# Patient Record
Sex: Female | Born: 1960 | Race: Black or African American | Hispanic: No | Marital: Single | Smoking: Former smoker
Health system: Southern US, Community
[De-identification: ages and names within clinical notes are randomized; demographics above are authoritative.]

## PROBLEM LIST (undated history)

## (undated) DIAGNOSIS — I10 Essential (primary) hypertension: Secondary | ICD-10-CM

## (undated) DIAGNOSIS — E119 Type 2 diabetes mellitus without complications: Secondary | ICD-10-CM

## (undated) DIAGNOSIS — M1990 Unspecified osteoarthritis, unspecified site: Secondary | ICD-10-CM

---

## 1990-05-16 HISTORY — PX: CHOLECYSTECTOMY, LAPAROSCOPIC: SHX56

## 2017-11-15 HISTORY — PX: CEREBRAL ANEURYSM REPAIR: SHX164

## 2019-01-29 DIAGNOSIS — M5441 Lumbago with sciatica, right side: Secondary | ICD-10-CM | POA: Diagnosis not present

## 2019-01-29 DIAGNOSIS — I10 Essential (primary) hypertension: Secondary | ICD-10-CM | POA: Diagnosis not present

## 2019-01-29 DIAGNOSIS — Z8679 Personal history of other diseases of the circulatory system: Secondary | ICD-10-CM | POA: Diagnosis not present

## 2019-02-13 DIAGNOSIS — M799 Soft tissue disorder, unspecified: Secondary | ICD-10-CM | POA: Diagnosis not present

## 2019-02-13 DIAGNOSIS — M545 Low back pain: Secondary | ICD-10-CM | POA: Diagnosis not present

## 2019-02-13 DIAGNOSIS — M6281 Muscle weakness (generalized): Secondary | ICD-10-CM | POA: Diagnosis not present

## 2019-02-13 DIAGNOSIS — M2560 Stiffness of unspecified joint, not elsewhere classified: Secondary | ICD-10-CM | POA: Diagnosis not present

## 2019-02-20 DIAGNOSIS — M799 Soft tissue disorder, unspecified: Secondary | ICD-10-CM | POA: Diagnosis not present

## 2019-02-20 DIAGNOSIS — M2560 Stiffness of unspecified joint, not elsewhere classified: Secondary | ICD-10-CM | POA: Diagnosis not present

## 2019-02-20 DIAGNOSIS — M545 Low back pain: Secondary | ICD-10-CM | POA: Diagnosis not present

## 2019-02-20 DIAGNOSIS — M6281 Muscle weakness (generalized): Secondary | ICD-10-CM | POA: Diagnosis not present

## 2019-02-27 DIAGNOSIS — M799 Soft tissue disorder, unspecified: Secondary | ICD-10-CM | POA: Diagnosis not present

## 2019-02-27 DIAGNOSIS — M2560 Stiffness of unspecified joint, not elsewhere classified: Secondary | ICD-10-CM | POA: Diagnosis not present

## 2019-02-27 DIAGNOSIS — M6281 Muscle weakness (generalized): Secondary | ICD-10-CM | POA: Diagnosis not present

## 2019-02-27 DIAGNOSIS — M545 Low back pain: Secondary | ICD-10-CM | POA: Diagnosis not present

## 2019-03-06 DIAGNOSIS — M2560 Stiffness of unspecified joint, not elsewhere classified: Secondary | ICD-10-CM | POA: Diagnosis not present

## 2019-03-06 DIAGNOSIS — M799 Soft tissue disorder, unspecified: Secondary | ICD-10-CM | POA: Diagnosis not present

## 2019-03-06 DIAGNOSIS — M6281 Muscle weakness (generalized): Secondary | ICD-10-CM | POA: Diagnosis not present

## 2019-03-06 DIAGNOSIS — M545 Low back pain: Secondary | ICD-10-CM | POA: Diagnosis not present

## 2019-03-13 DIAGNOSIS — M2560 Stiffness of unspecified joint, not elsewhere classified: Secondary | ICD-10-CM | POA: Diagnosis not present

## 2019-03-13 DIAGNOSIS — M799 Soft tissue disorder, unspecified: Secondary | ICD-10-CM | POA: Diagnosis not present

## 2019-03-13 DIAGNOSIS — M6281 Muscle weakness (generalized): Secondary | ICD-10-CM | POA: Diagnosis not present

## 2019-03-13 DIAGNOSIS — M545 Low back pain: Secondary | ICD-10-CM | POA: Diagnosis not present

## 2019-03-19 DIAGNOSIS — Z23 Encounter for immunization: Secondary | ICD-10-CM | POA: Diagnosis not present

## 2019-03-20 DIAGNOSIS — M799 Soft tissue disorder, unspecified: Secondary | ICD-10-CM | POA: Diagnosis not present

## 2019-03-20 DIAGNOSIS — M545 Low back pain: Secondary | ICD-10-CM | POA: Diagnosis not present

## 2019-03-20 DIAGNOSIS — M6281 Muscle weakness (generalized): Secondary | ICD-10-CM | POA: Diagnosis not present

## 2019-03-20 DIAGNOSIS — M2560 Stiffness of unspecified joint, not elsewhere classified: Secondary | ICD-10-CM | POA: Diagnosis not present

## 2019-03-27 DIAGNOSIS — M545 Low back pain: Secondary | ICD-10-CM | POA: Diagnosis not present

## 2019-03-27 DIAGNOSIS — M2560 Stiffness of unspecified joint, not elsewhere classified: Secondary | ICD-10-CM | POA: Diagnosis not present

## 2019-03-27 DIAGNOSIS — M799 Soft tissue disorder, unspecified: Secondary | ICD-10-CM | POA: Diagnosis not present

## 2019-03-27 DIAGNOSIS — M6281 Muscle weakness (generalized): Secondary | ICD-10-CM | POA: Diagnosis not present

## 2019-04-03 DIAGNOSIS — M545 Low back pain: Secondary | ICD-10-CM | POA: Diagnosis not present

## 2019-04-03 DIAGNOSIS — M6281 Muscle weakness (generalized): Secondary | ICD-10-CM | POA: Diagnosis not present

## 2019-04-03 DIAGNOSIS — M799 Soft tissue disorder, unspecified: Secondary | ICD-10-CM | POA: Diagnosis not present

## 2019-04-03 DIAGNOSIS — M2560 Stiffness of unspecified joint, not elsewhere classified: Secondary | ICD-10-CM | POA: Diagnosis not present

## 2019-04-17 DIAGNOSIS — M6281 Muscle weakness (generalized): Secondary | ICD-10-CM | POA: Diagnosis not present

## 2019-04-17 DIAGNOSIS — M799 Soft tissue disorder, unspecified: Secondary | ICD-10-CM | POA: Diagnosis not present

## 2019-04-17 DIAGNOSIS — M2560 Stiffness of unspecified joint, not elsewhere classified: Secondary | ICD-10-CM | POA: Diagnosis not present

## 2019-04-17 DIAGNOSIS — M545 Low back pain: Secondary | ICD-10-CM | POA: Diagnosis not present

## 2019-04-22 DIAGNOSIS — M79662 Pain in left lower leg: Secondary | ICD-10-CM | POA: Diagnosis not present

## 2019-04-22 DIAGNOSIS — I10 Essential (primary) hypertension: Secondary | ICD-10-CM | POA: Diagnosis not present

## 2019-04-23 ENCOUNTER — Other Ambulatory Visit: Payer: Self-pay | Admitting: Physician Assistant

## 2019-04-23 DIAGNOSIS — M79662 Pain in left lower leg: Secondary | ICD-10-CM

## 2019-04-24 DIAGNOSIS — Z23 Encounter for immunization: Secondary | ICD-10-CM | POA: Diagnosis not present

## 2019-04-28 ENCOUNTER — Ambulatory Visit
Admission: RE | Admit: 2019-04-28 | Discharge: 2019-04-28 | Disposition: A | Discharge status: Home / Self Care | Payer: Federal, State, Local not specified - PPO | Source: Ambulatory Visit | Attending: Physician Assistant | Admitting: Physician Assistant

## 2019-04-28 DIAGNOSIS — M79662 Pain in left lower leg: Secondary | ICD-10-CM | POA: Diagnosis not present

## 2019-05-06 DIAGNOSIS — I619 Nontraumatic intracerebral hemorrhage, unspecified: Secondary | ICD-10-CM | POA: Diagnosis not present

## 2019-05-06 DIAGNOSIS — I10 Essential (primary) hypertension: Secondary | ICD-10-CM | POA: Insufficient documentation

## 2019-05-15 DIAGNOSIS — M6281 Muscle weakness (generalized): Secondary | ICD-10-CM | POA: Diagnosis not present

## 2019-05-15 DIAGNOSIS — M545 Low back pain: Secondary | ICD-10-CM | POA: Diagnosis not present

## 2019-05-15 DIAGNOSIS — M799 Soft tissue disorder, unspecified: Secondary | ICD-10-CM | POA: Diagnosis not present

## 2019-05-15 DIAGNOSIS — M2560 Stiffness of unspecified joint, not elsewhere classified: Secondary | ICD-10-CM | POA: Diagnosis not present

## 2019-05-18 DIAGNOSIS — Z131 Encounter for screening for diabetes mellitus: Secondary | ICD-10-CM | POA: Diagnosis not present

## 2019-05-18 DIAGNOSIS — Z1329 Encounter for screening for other suspected endocrine disorder: Secondary | ICD-10-CM | POA: Diagnosis not present

## 2019-05-18 DIAGNOSIS — Z13220 Encounter for screening for lipoid disorders: Secondary | ICD-10-CM | POA: Diagnosis not present

## 2019-05-18 DIAGNOSIS — Z130 Encounter for screening for diseases of the blood and blood-forming organs and certain disorders involving the immune mechanism: Secondary | ICD-10-CM | POA: Diagnosis not present

## 2019-05-18 DIAGNOSIS — Z0000 Encounter for general adult medical examination without abnormal findings: Secondary | ICD-10-CM | POA: Diagnosis not present

## 2019-05-29 DIAGNOSIS — M6281 Muscle weakness (generalized): Secondary | ICD-10-CM | POA: Diagnosis not present

## 2019-05-29 DIAGNOSIS — M2560 Stiffness of unspecified joint, not elsewhere classified: Secondary | ICD-10-CM | POA: Diagnosis not present

## 2019-05-29 DIAGNOSIS — M545 Low back pain: Secondary | ICD-10-CM | POA: Diagnosis not present

## 2019-05-29 DIAGNOSIS — M799 Soft tissue disorder, unspecified: Secondary | ICD-10-CM | POA: Diagnosis not present

## 2019-06-05 DIAGNOSIS — J0100 Acute maxillary sinusitis, unspecified: Secondary | ICD-10-CM | POA: Diagnosis not present

## 2019-06-26 DIAGNOSIS — M545 Low back pain: Secondary | ICD-10-CM | POA: Diagnosis not present

## 2019-06-26 DIAGNOSIS — M6281 Muscle weakness (generalized): Secondary | ICD-10-CM | POA: Diagnosis not present

## 2019-06-26 DIAGNOSIS — M799 Soft tissue disorder, unspecified: Secondary | ICD-10-CM | POA: Diagnosis not present

## 2019-06-26 DIAGNOSIS — M2560 Stiffness of unspecified joint, not elsewhere classified: Secondary | ICD-10-CM | POA: Diagnosis not present

## 2019-11-19 DIAGNOSIS — I10 Essential (primary) hypertension: Secondary | ICD-10-CM | POA: Diagnosis not present

## 2019-11-19 DIAGNOSIS — D473 Essential (hemorrhagic) thrombocythemia: Secondary | ICD-10-CM | POA: Diagnosis not present

## 2019-11-19 DIAGNOSIS — Z23 Encounter for immunization: Secondary | ICD-10-CM | POA: Diagnosis not present

## 2020-05-24 DIAGNOSIS — Z0000 Encounter for general adult medical examination without abnormal findings: Secondary | ICD-10-CM | POA: Diagnosis not present

## 2020-05-24 DIAGNOSIS — R7301 Impaired fasting glucose: Secondary | ICD-10-CM | POA: Diagnosis not present

## 2020-05-24 DIAGNOSIS — I10 Essential (primary) hypertension: Secondary | ICD-10-CM | POA: Diagnosis not present

## 2020-05-24 DIAGNOSIS — Z13220 Encounter for screening for lipoid disorders: Secondary | ICD-10-CM | POA: Diagnosis not present

## 2020-05-24 DIAGNOSIS — K219 Gastro-esophageal reflux disease without esophagitis: Secondary | ICD-10-CM | POA: Diagnosis not present

## 2020-06-08 DIAGNOSIS — I10 Essential (primary) hypertension: Secondary | ICD-10-CM | POA: Diagnosis not present

## 2020-06-27 DIAGNOSIS — I10 Essential (primary) hypertension: Secondary | ICD-10-CM | POA: Diagnosis not present

## 2020-06-27 DIAGNOSIS — R779 Abnormality of plasma protein, unspecified: Secondary | ICD-10-CM | POA: Diagnosis not present

## 2020-06-27 DIAGNOSIS — R7303 Prediabetes: Secondary | ICD-10-CM | POA: Diagnosis not present

## 2020-07-14 DIAGNOSIS — E8809 Other disorders of plasma-protein metabolism, not elsewhere classified: Secondary | ICD-10-CM | POA: Diagnosis not present

## 2020-08-05 DIAGNOSIS — F5101 Primary insomnia: Secondary | ICD-10-CM | POA: Diagnosis not present

## 2020-08-05 DIAGNOSIS — R7303 Prediabetes: Secondary | ICD-10-CM | POA: Diagnosis not present

## 2020-08-05 DIAGNOSIS — I10 Essential (primary) hypertension: Secondary | ICD-10-CM | POA: Diagnosis not present

## 2020-10-24 DIAGNOSIS — Z23 Encounter for immunization: Secondary | ICD-10-CM | POA: Diagnosis not present

## 2020-10-24 DIAGNOSIS — R778 Other specified abnormalities of plasma proteins: Secondary | ICD-10-CM | POA: Diagnosis not present

## 2020-10-24 DIAGNOSIS — M25551 Pain in right hip: Secondary | ICD-10-CM | POA: Diagnosis not present

## 2020-10-24 DIAGNOSIS — M79651 Pain in right thigh: Secondary | ICD-10-CM | POA: Diagnosis not present

## 2020-11-02 NOTE — Progress Notes (Signed)
[facility] [address] [place] [address]   CLINIC:  Medical Oncology/Hematology  Patient Care Team: Adrienne Mocha, [place] (Inactive) as PCP - General (Physician Assistant) Doreatha Massed, MD as Medical Oncologist (Hematology)  CHIEF COMPLAINTS/PURPOSE OF CONSULTATION:  Evaluation of elevated total protein  HISTORY OF PRESENTING ILLNESS:  Ruth Johnston 60 y.o. female is here because of evaluation of elevated total protein.  Today she reports feeling fair. She reports constant severe aching pain in her right thigh as well as shooting pain in her knees and hips that have been present for 3 weeks; this pain is alleviated when she is in a pool. She denies night sweats, fevers, hot flashes, weight loss, and recent infections. She reports occasional tingling/numbness in her right hand. She has never previously had a blood transfusion, and she denies history of hepatitis C, lupus, and RA. She had 1 previous miscarriage, and she reports occasional nosebleeds. She had a brain aneurysm in 2019 while she was living in [place]; she retired following the aneurysm. She reports a blood clot in her calf that was diagnosed following the surgery to treat her aneurysm. She moved from [place] to [place] to be closer to her mother who lives in [place]. 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 She quit smoking in 2019 following her aneurysm; while smoking she smoked 1 cigarette a day. She denies family history of aneurysm, cancer, myeloma, and leukemia.   MEDICAL HISTORY:  History reviewed. No pertinent past medical history.  SURGICAL HISTORY: Past Surgical History:  Procedure Laterality Date   CEREBRAL ANEURYSM REPAIR N/A 11/2017   CHOLECYSTECTOMY, LAPAROSCOPIC N/A 05/1990    SOCIAL HISTORY: Social History   Socioeconomic History   Marital status: Single    Spouse name: Not on file   Number of children: Not on file   Years of education: Not on file   Highest  education level: Not on file  Occupational History   Not on file  Tobacco Use   Smoking status: Former    Types: Cigars    Quit date: 11/15/2017    Years since quitting: 2.9   Smokeless tobacco: Never  Substance and Sexual Activity   Alcohol use: Not Currently   Drug use: Never   Sexual activity: Not Currently  Other Topics Concern   Not on file  Social History Narrative   Not on file   Social Determinants of Health   Financial Resource Strain: Low Risk    Difficulty of Paying Living Expenses: Not hard at all  Food Insecurity: No Food Insecurity   Worried About Programme researcher, broadcasting/film/video in the Last Year: Never true   Ran Out of Food in the Last Year: Never true  Transportation Needs: No Transportation Needs   Lack of Transportation (Medical): No   Lack of Transportation (Non-Medical): No  Physical Activity: Insufficiently Active   Days of Exercise per Week: 3 days   Minutes of Exercise per Session: 30 min  Stress: No Stress Concern Present   Feeling of Stress : Only a little  Social Connections: Moderately Integrated   Frequency of Communication with Friends and Family: More than three times a week   Frequency of Social Gatherings with Friends and Family: More than three times a week   Attends Religious Services: More than 4 times per year   Active Member of [employer] or Organizations: Yes   Attends Banker Meetings: More than 4 times per year  Marital Status: Divorced  Catering manager Violence: Not At Risk   Fear of Current or Ex-Partner: No   Emotionally Abused: No   Physically Abused: No   Sexually Abused: No    FAMILY HISTORY: Family History  Problem Relation Age of Onset   Hypertension Mother    Heart failure Father    Hypertension Sister     ALLERGIES:  has No Known Allergies.  MEDICATIONS:  Current Outpatient Medications  Medication Sig Dispense Refill   acetaminophen (TYLENOL) 500 MG tablet Take 1,000 mg by mouth every 6 (six) hours as needed.      amLODipine (NORVASC) 10 MG tablet Take by mouth.     aspirin 81 MG chewable tablet Chew 81 mg by mouth daily.     omeprazole (PRILOSEC) 40 MG capsule Take 40 mg by mouth every morning.     traZODone (DESYREL) 50 MG tablet Take 50 mg by mouth at bedtime as needed.     valsartan-hydrochlorothiazide (DIOVAN-HCT) 160-25 MG tablet Take 1 tablet by mouth daily.     No current facility-administered medications for this visit.    REVIEW OF SYSTEMS:   Review of Systems  Constitutional:  Positive for fatigue (25%). Negative for appetite change (50%), fever and unexpected weight change.  HENT:   Positive for nosebleeds (occasional).   Gastrointestinal:  Positive for constipation.  Endocrine: Negative for hot flashes.  Musculoskeletal:  Positive for myalgias (7/10 R thigh).  Neurological:  Positive for numbness (R hand).  All other systems reviewed and are negative.   PHYSICAL EXAMINATION: ECOG PERFORMANCE STATUS: 1 - Symptomatic but completely ambulatory  Vitals:   11/03/20 1345  BP: (!) 141/86  Pulse: 93  Resp: 18  Temp: 98.4 F (36.9 C)  SpO2: 95%   Filed Weights   11/03/20 1345  Weight: 240 lb 3.2 oz (109 kg)   Physical Exam Vitals reviewed.  Constitutional:      Appearance: Normal appearance.  Cardiovascular:     Rate and Rhythm: Normal rate and regular rhythm.     Pulses: Normal pulses.     Heart sounds: Normal heart sounds.  Pulmonary:     Effort: Pulmonary effort is normal.     Breath sounds: Normal breath sounds.  Abdominal:     Palpations: Abdomen is soft. There is no hepatomegaly, splenomegaly or mass.     Tenderness: There is no abdominal tenderness.  Musculoskeletal:     Right upper leg: No tenderness.     Right lower leg: No edema.     Left lower leg: No edema.  Lymphadenopathy:     Cervical: No cervical adenopathy.     Right cervical: No superficial, deep or posterior cervical adenopathy.    Left cervical: No superficial, deep or posterior cervical  adenopathy.     Upper Body:     Right upper body: No supraclavicular, axillary or pectoral adenopathy.     Left upper body: No supraclavicular, axillary or pectoral adenopathy.  Neurological:     General: No focal deficit present.     Mental Status: She is alert and oriented to person, place, and time.  Psychiatric:        Mood and Affect: Mood normal.        Behavior: Behavior normal.     LABORATORY DATA:  I have reviewed the data as listed Recent Results (from the past 2160 hour(s))  C-reactive protein     Status: Abnormal   Collection Time: 11/03/20  2:18 PM  Result Value Ref Range  CRP 1.6 (H) <1.0 mg/dL    Comment: Performed at [facility], [address], [place], [place] [address]  Sedimentation rate     Status: Abnormal   Collection Time: 11/03/20  2:18 PM  Result Value Ref Range   Sed Rate 36 (H) 0 - 22 mm/hr    Comment: Performed at [facility], [address] [place], [place] [address]  Comprehensive metabolic panel     Status: Abnormal   Collection Time: 11/03/20  2:18 PM  Result Value Ref Range   Sodium 137 135 - 145 mmol/L   Potassium 3.4 (L) 3.5 - 5.1 mmol/L   Chloride 102 98 - 111 mmol/L   CO2 22 22 - 32 mmol/L   Glucose, Bld 83 70 - 99 mg/dL    Comment: Glucose reference range applies only to samples taken after fasting for at least 8 hours.   BUN 9 6 - 20 mg/dL   Creatinine, Ser 4.62 0.44 - 1.00 mg/dL   Calcium 9.3 8.9 - 70.3 mg/dL   Total Protein 9.7 (H) 6.5 - 8.1 g/dL   Albumin 4.2 3.5 - 5.0 g/dL   AST 26 15 - 41 U/L   ALT 24 0 - 44 U/L   Alkaline Phosphatase 54 38 - 126 U/L   Total Bilirubin 0.4 0.3 - 1.2 mg/dL   GFR, Estimated >50 >09 mL/min    Comment: (NOTE) Calculated using the CKD-EPI Creatinine Equation (2021)    Anion gap 13 5 - 15    Comment: Performed at [facility], [address] [place], [place] [address]  CBC with Differential     Status: None   Collection Time: 11/03/20  2:18 PM  Result Value Ref Range   WBC 5.3 4.0 - 10.5  K/uL   RBC 4.83 3.87 - 5.11 MIL/uL   Hemoglobin 14.3 12.0 - 15.0 g/dL   HCT 99.3 71.6 - 96.7 %   MCV 90.5 80.0 - 100.0 fL   MCH 29.6 26.0 - 34.0 pg   MCHC 32.7 30.0 - 36.0 g/dL   RDW 89.3 81.0 - 17.5 %   Platelets 384 150 - 400 K/uL   nRBC 0.0 0.0 - 0.2 %   Neutrophils Relative % 50 %   Neutro Abs 2.7 1.7 - 7.7 K/uL   Lymphocytes Relative 40 %   Lymphs Abs 2.2 0.7 - 4.0 K/uL   Monocytes Relative 8 %   Monocytes Absolute 0.4 0.1 - 1.0 K/uL   Eosinophils Relative 1 %   Eosinophils Absolute 0.0 0.0 - 0.5 K/uL   Basophils Relative 1 %   Basophils Absolute 0.1 0.0 - 0.1 K/uL   Immature Granulocytes 0 %   Abs Immature Granulocytes 0.02 0.00 - 0.07 K/uL    Comment: Performed at [facility], [address] [place], [place] [address]  Lactate dehydrogenase     Status: Abnormal   Collection Time: 11/03/20  2:18 PM  Result Value Ref Range   LDH 211 (H) 98 - 192 U/L    Comment: Performed at [facility], [address] [place], [place] [address]    RADIOGRAPHIC STUDIES: I have personally reviewed the radiological images as listed and agreed with the findings in the report. No results found.  ASSESSMENT:  Elevated total protein: - Patient seen at the request of [facility] for elevated total protein. - Labs from 10/24/2020 with total protein 8.8, 9.3 (06/08/2020), 9.1 (05/24/2020), 8.9 (11/19/2019), 8.9 (05/18/2019). - CBC showed mild elevated platelet count ranging between 407 and  455. - Calcium was slightly elevated at 10.6 on 10/24/2020.  SPEP was negative on 06/27/2020. - She reports right thigh pain for the last 3 weeks, aching type on and off, worse on standing and walking.  Also reports worsening energy levels. - Denies any fevers, night sweats or weight loss.  Denies any infections.  Tingling and numbness in the right hand due to carpal tunnel. - She has ruptured brain aneurysm in November 2019 in [place], underwent surgery.  No prior history of blood transfusion.  Had 1  episode of miscarriage.  Also reports questionable left leg SVT.  Social/family history: - She retired from [employer] and used to live and work in [place], [place].  Recently moved to [place] as her mother lives in [place].  Quit smoking in November 2019.  She used to smoke skinny cigar daily prior to that. - No family history of cancer.  No family history of myeloma or leukemia.   PLAN:  Elevated total protein: - We have reviewed her previous labs with the patient. - Recommend work-up for plasma cell disorders with SPEP, immunofixation and free light chains along with beta-2 microglobulin, LDH.  We will also check lupus anticoagulant and hepatitis panel. - We will also recommend skeletal survey. - RTC 2 weeks for follow-up.  Mild hypercalcemia: - She was found to have an elevated calcium of 10.6 on labs from 10/24/2020. - We will check intact PTH, 25-hydroxy vitamin D level, one 25-hydroxy vitamin D.   All questions were answered. The patient knows to call the clinic with any problems, questions or concerns.  Doreatha Massed, MD 11/03/20 5:00 PM  [facility] [phone]   I, Alda Ponder, am acting as a scribe for Dr. Doreatha Massed.  I, Doreatha Massed MD, have reviewed the above documentation for accuracy and completeness, and I agree with the above.

## 2020-11-03 ENCOUNTER — Other Ambulatory Visit: Payer: Self-pay

## 2020-11-03 ENCOUNTER — Inpatient Hospital Stay (HOSPITAL_COMMUNITY): Payer: Federal, State, Local not specified - PPO

## 2020-11-03 ENCOUNTER — Encounter (HOSPITAL_COMMUNITY): Payer: Self-pay | Admitting: Hematology

## 2020-11-03 ENCOUNTER — Inpatient Hospital Stay (HOSPITAL_COMMUNITY): Payer: Federal, State, Local not specified - PPO | Attending: Hematology | Admitting: Hematology

## 2020-11-03 ENCOUNTER — Ambulatory Visit (HOSPITAL_COMMUNITY)
Admission: RE | Admit: 2020-11-03 | Discharge: 2020-11-03 | Disposition: A | Discharge status: Home / Self Care | Payer: Federal, State, Local not specified - PPO | Source: Ambulatory Visit | Attending: Hematology | Admitting: Hematology

## 2020-11-03 VITALS — BP 141/86 | HR 93 | Temp 98.4°F | Resp 18 | Ht 66.0 in | Wt 240.2 lb

## 2020-11-03 DIAGNOSIS — Z87891 Personal history of nicotine dependence: Secondary | ICD-10-CM | POA: Insufficient documentation

## 2020-11-03 DIAGNOSIS — R809 Proteinuria, unspecified: Secondary | ICD-10-CM | POA: Diagnosis not present

## 2020-11-03 DIAGNOSIS — K219 Gastro-esophageal reflux disease without esophagitis: Secondary | ICD-10-CM | POA: Insufficient documentation

## 2020-11-03 DIAGNOSIS — R779 Abnormality of plasma protein, unspecified: Secondary | ICD-10-CM | POA: Diagnosis not present

## 2020-11-03 LAB — CBC WITH DIFFERENTIAL/PLATELET
Abs Immature Granulocytes: 0.02 10*3/uL (ref 0.00–0.07)
Basophils Absolute: 0.1 10*3/uL (ref 0.0–0.1)
Basophils Relative: 1 %
Eosinophils Absolute: 0 10*3/uL (ref 0.0–0.5)
Eosinophils Relative: 1 %
HCT: 43.7 % (ref 36.0–46.0)
Hemoglobin: 14.3 g/dL (ref 12.0–15.0)
Immature Granulocytes: 0 %
Lymphocytes Relative: 40 %
Lymphs Abs: 2.2 10*3/uL (ref 0.7–4.0)
MCH: 29.6 pg (ref 26.0–34.0)
MCHC: 32.7 g/dL (ref 30.0–36.0)
MCV: 90.5 fL (ref 80.0–100.0)
Monocytes Absolute: 0.4 10*3/uL (ref 0.1–1.0)
Monocytes Relative: 8 %
Neutro Abs: 2.7 10*3/uL (ref 1.7–7.7)
Neutrophils Relative %: 50 %
Platelets: 384 10*3/uL (ref 150–400)
RBC: 4.83 MIL/uL (ref 3.87–5.11)
RDW: 13 % (ref 11.5–15.5)
WBC: 5.3 10*3/uL (ref 4.0–10.5)
nRBC: 0 % (ref 0.0–0.2)

## 2020-11-03 LAB — COMPREHENSIVE METABOLIC PANEL
ALT: 24 U/L (ref 0–44)
AST: 26 U/L (ref 15–41)
Albumin: 4.2 g/dL (ref 3.5–5.0)
Alkaline Phosphatase: 54 U/L (ref 38–126)
Anion gap: 13 (ref 5–15)
BUN: 9 mg/dL (ref 6–20)
CO2: 22 mmol/L (ref 22–32)
Calcium: 9.3 mg/dL (ref 8.9–10.3)
Chloride: 102 mmol/L (ref 98–111)
Creatinine, Ser: 1 mg/dL (ref 0.44–1.00)
GFR, Estimated: >60 mL/min (ref >60)
Glucose, Bld: 83 mg/dL (ref 70–99)
Potassium: 3.4 mmol/L — ABNORMAL LOW (ref 3.5–5.1)
Sodium: 137 mmol/L (ref 135–145)
Total Bilirubin: 0.4 mg/dL (ref 0.3–1.2)
Total Protein: 9.7 g/dL — ABNORMAL HIGH (ref 6.5–8.1)

## 2020-11-03 LAB — SEDIMENTATION RATE: Sed Rate: 36 mm/hr — ABNORMAL HIGH (ref 0–22)

## 2020-11-03 LAB — VITAMIN D 25 HYDROXY (VIT D DEFICIENCY, FRACTURES): Vit D, 25-Hydroxy: 61.53 ng/mL (ref 30–100)

## 2020-11-03 LAB — HEPATITIS C ANTIBODY: HCV Ab: NONREACTIVE

## 2020-11-03 LAB — HEPATITIS B SURFACE ANTIBODY,QUALITATIVE: Hep B S Ab: NONREACTIVE

## 2020-11-03 LAB — LACTATE DEHYDROGENASE: LDH: 211 U/L — ABNORMAL HIGH (ref 98–192)

## 2020-11-03 LAB — HEPATITIS B CORE ANTIBODY, TOTAL: Hep B Core Total Ab: NONREACTIVE

## 2020-11-03 LAB — C-REACTIVE PROTEIN: CRP: 1.6 mg/dL — ABNORMAL HIGH (ref <1.0)

## 2020-11-03 NOTE — Patient Instructions (Addendum)
[facility] at [facility] Discharge Instructions  You were seen and examined today by Dr. Ellin Saba. Dr. Ellin Saba is a hematologist, meaning that he specializes in blood disorders. Dr. Ellin Saba discussed your past medical history, family history of blood disorders/cancers, and the events that led to you being here today.  You were referred to Dr. Ellin Saba due to an abnormal protein present in your recent blood work.  Dr. Ellin Saba has recommended additional blood work today in an attempt to identify the cause of your elevated protein presence. The most common cause is MGUS. This is a blood disorder that is not cancer, but it does require monitoring.  Dr. Ellin Saba also recommends a bone survey. This is a X-Ray of all of your bones. The abnormal protein can cause abnormalities with the bones, this is just a precautionary scan.  Follow-up as scheduled.   Thank you for choosing [facility] at [facility] to provide your oncology and hematology care.  To afford each patient quality time with our provider, please arrive at least 15 minutes before your scheduled appointment time.   If you have a lab appointment with the Cancer Center please come in thru the Main Entrance and check in at the main information desk.  You need to re-schedule your appointment should you arrive 10 or more minutes late.  We strive to give you quality time with our providers, and arriving late affects you and other patients whose appointments are after yours.  Also, if you no show three or more times for appointments you may be dismissed from the clinic at the providers discretion.     Again, thank you for choosing [facility].  Our hope is that these requests will decrease the amount of time that you wait before being seen by our physicians.       _____________________________________________________________  Should you have questions after your visit to  [facility], please contact our office at [phone] and follow the prompts.  Our office hours are 8:00 a.m. and 4:30 p.m. Monday - Friday.  Please note that voicemails left after 4:00 p.m. may not be returned until the following business day.  We are closed weekends and major holidays.  You do have access to a nurse 24-7, just call the main number to the clinic [phone] and do not press any options, hold on the line and a nurse will answer the phone.    For prescription refill requests, have your pharmacy contact our office and allow 72 hours.    Due to Covid, you will need to wear a mask upon entering the hospital. If you do not have a mask, a mask will be given to you at the Main Entrance upon arrival. For doctor visits, patients may have 1 support person age 63 or older with them. For treatment visits, patients can not have anyone with them due to social distancing guidelines and our immunocompromised population.

## 2020-11-04 LAB — CALCITRIOL (1,25 DI-OH VIT D): Vit D, 1,25-Dihydroxy: 73.1 pg/mL (ref 24.8–81.5)

## 2020-11-04 LAB — PTH, INTACT AND CALCIUM
Calcium, Total (PTH): 10.3 mg/dL — ABNORMAL HIGH (ref 8.7–10.2)
PTH: 17 pg/mL (ref 15–65)

## 2020-11-04 LAB — LUPUS ANTICOAGULANT PANEL
DRVVT: 43.3 s (ref 0.0–47.0)
PTT Lupus Anticoagulant: 32.9 s (ref 0.0–51.9)

## 2020-11-04 LAB — KAPPA/LAMBDA LIGHT CHAINS
Kappa free light chain: 111.4 mg/L — ABNORMAL HIGH (ref 3.3–19.4)
Kappa, lambda light chain ratio: 5.38 — ABNORMAL HIGH (ref 0.26–1.65)
Lambda free light chains: 20.7 mg/L (ref 5.7–26.3)

## 2020-11-04 LAB — ANTINUCLEAR ANTIBODIES, IFA: ANA Ab, IFA: NEGATIVE

## 2020-11-04 LAB — RHEUMATOID FACTOR: Rheumatoid fact SerPl-aCnc: 10.6 IU/mL (ref <14.0)

## 2020-11-04 LAB — HEPATITIS B SURFACE ANTIGEN: Hepatitis B Surface Ag: NONREACTIVE

## 2020-11-04 LAB — BETA 2 MICROGLOBULIN, SERUM: Beta-2 Microglobulin: 2.1 mg/L (ref 0.6–2.4)

## 2020-11-08 LAB — PROTEIN ELECTROPHORESIS, SERUM
A/G Ratio: 0.7 (ref 0.7–1.7)
Albumin ELP: 3.7 g/dL (ref 2.9–4.4)
Alpha-1-Globulin: 0.3 g/dL (ref 0.0–0.4)
Alpha-2-Globulin: 0.9 g/dL (ref 0.4–1.0)
Beta Globulin: 1.5 g/dL — ABNORMAL HIGH (ref 0.7–1.3)
Gamma Globulin: 2.9 g/dL — ABNORMAL HIGH (ref 0.4–1.8)
Globulin, Total: 5.6 g/dL — ABNORMAL HIGH (ref 2.2–3.9)
Total Protein ELP: 9.3 g/dL — ABNORMAL HIGH (ref 6.0–8.5)

## 2020-11-09 DIAGNOSIS — M25551 Pain in right hip: Secondary | ICD-10-CM | POA: Diagnosis not present

## 2020-11-11 LAB — IMMUNOFIXATION ELECTROPHORESIS
IgA: 224 mg/dL (ref 87–352)
IgG (Immunoglobin G), Serum: 3267 mg/dL — ABNORMAL HIGH (ref 586–1602)
IgM (Immunoglobulin M), Srm: 84 mg/dL (ref 26–217)

## 2020-11-19 NOTE — Progress Notes (Signed)
[address], [place] [address]   CLINIC:  Medical Oncology/Hematology  PCP:  Ephriam Jenkins, PA (Inactive) [place] [address]  [phone]  REASON FOR VISIT:  Follow-up for elevated total protein  PRIOR THERAPY: none  CURRENT THERAPY: under work-up  INTERVAL HISTORY:  Ruth Johnston, a 60 y.o. female, returns for routine follow-up for her elevated total protein. Ruth Johnston was last seen on 11/03/2020.  Today she reports feeling well. She denies history of lupus. She is not currently taking calcium, but she is currently taking 125 mg of Vitamin D3. She reports occasional numbness in her right hand. She reports pain in her right leg which disrupts her sleep.   REVIEW OF SYSTEMS:  Review of Systems  Constitutional:  Positive for fatigue (depleted). Negative for appetite change (70%).  Musculoskeletal:  Positive for arthralgias (R leg) and flank pain (R side).  Neurological:  Positive for headaches and numbness (R hand).  Psychiatric/Behavioral:  Positive for sleep disturbance.   All other systems reviewed and are negative.  PAST MEDICAL/SURGICAL HISTORY:  No past medical history on file. Past Surgical History:  Procedure Laterality Date   CEREBRAL ANEURYSM REPAIR N/A 11/2017   CHOLECYSTECTOMY, LAPAROSCOPIC N/A 05/1990    SOCIAL HISTORY:  Social History   Socioeconomic History   Marital status: Single    Spouse name: Not on file   Number of children: Not on file   Years of education: Not on file   Highest education level: Not on file  Occupational History   Not on file  Tobacco Use   Smoking status: Former    Types: Cigars    Quit date: 11/15/2017    Years since quitting: 3.0   Smokeless tobacco: Never  Substance and Sexual Activity   Alcohol use: Not Currently   Drug use: Never   Sexual activity: Not Currently  Other Topics Concern   Not on file  Social History Narrative   Not on file    Social Determinants of Health   Financial Resource Strain: Low Risk    Difficulty of Paying Living Expenses: Not hard at all  Food Insecurity: No Food Insecurity   Worried About Charity fundraiser in the Last Year: Never true   [place] in the Last Year: Never true  Transportation Needs: No Transportation Needs   Lack of Transportation (Medical): No   Lack of Transportation (Non-Medical): No  Physical Activity: Insufficiently Active   Days of Exercise per Week: 3 days   Minutes of Exercise per Session: 30 min  Stress: No Stress Concern Present   Feeling of Stress : Only a little  Social Connections: Moderately Integrated   Frequency of Communication with Friends and Family: More than three times a week   Frequency of Social Gatherings with Friends and Family: More than three times a week   Attends Religious Services: More than 4 times per year   Active Member of [employer] or Organizations: Yes   Attends Music therapist: More than 4 times per year   Marital Status: Divorced  Human resources officer Violence: Not At Risk   Fear of Current or Ex-Partner: No   Emotionally Abused: No   Physically Abused: No   Sexually Abused: No    FAMILY HISTORY:  Family History  Problem Relation Age of Onset   Hypertension Mother    Heart failure Father    Hypertension Sister  CURRENT MEDICATIONS:  Current Outpatient Medications  Medication Sig Dispense Refill   acetaminophen (TYLENOL) 500 MG tablet Take 1,000 mg by mouth every 6 (six) hours as needed.     amLODipine (NORVASC) 10 MG tablet Take by mouth.     aspirin 81 MG chewable tablet Chew 81 mg by mouth daily.     omeprazole (PRILOSEC) 40 MG capsule Take 40 mg by mouth every morning.     traZODone (DESYREL) 50 MG tablet Take 50 mg by mouth at bedtime as needed.     valsartan-hydrochlorothiazide (DIOVAN-HCT) 160-25 MG tablet Take 1 tablet by mouth daily.     No current facility-administered medications for this visit.     ALLERGIES:  No Known Allergies  PHYSICAL EXAM:  Performance status (ECOG): 1 - Symptomatic but completely ambulatory  There were no vitals filed for this visit. Wt Readings from Last 3 Encounters:  11/03/20 240 lb 3.2 oz (109 kg)   Physical Exam Vitals reviewed.  Constitutional:      Appearance: Normal appearance. She is obese.  Cardiovascular:     Rate and Rhythm: Normal rate and regular rhythm.     Pulses: Normal pulses.     Heart sounds: Normal heart sounds.  Pulmonary:     Effort: Pulmonary effort is normal.     Breath sounds: Normal breath sounds.  Neurological:     General: No focal deficit present.     Mental Status: She is alert and oriented to person, place, and time.  Psychiatric:        Mood and Affect: Mood normal.        Behavior: Behavior normal.    LABORATORY DATA:  I have reviewed the labs as listed.  CBC Latest Ref Rng & Units 11/03/2020  WBC 4.0 - 10.5 K/uL 5.3  Hemoglobin 12.0 - 15.0 g/dL 14.3  Hematocrit 36.0 - 46.0 % 43.7  Platelets 150 - 400 K/uL 384   CMP Latest Ref Rng & Units 11/03/2020 11/03/2020  Glucose 70 - 99 mg/dL 83 -  BUN 6 - 20 mg/dL 9 -  Creatinine 0.44 - 1.00 mg/dL 1.00 -  Sodium 135 - 145 mmol/L 137 -  Potassium 3.5 - 5.1 mmol/L 3.4(L) -  Chloride 98 - 111 mmol/L 102 -  CO2 22 - 32 mmol/L 22 -  Calcium 8.7 - 10.2 mg/dL 9.3 10.3(H)  Total Protein 6.5 - 8.1 g/dL 9.7(H) -  Total Bilirubin 0.3 - 1.2 mg/dL 0.4 -  Alkaline Phos 38 - 126 U/L 54 -  AST 15 - 41 U/L 26 -  ALT 0 - 44 U/L 24 -      Component Value Date/Time   RBC 4.83 11/03/2020 1418   MCV 90.5 11/03/2020 1418   MCH 29.6 11/03/2020 1418   MCHC 32.7 11/03/2020 1418   RDW 13.0 11/03/2020 1418   LYMPHSABS 2.2 11/03/2020 1418   MONOABS 0.4 11/03/2020 1418   EOSABS 0.0 11/03/2020 1418   BASOSABS 0.1 11/03/2020 1418    DIAGNOSTIC IMAGING:  I have independently reviewed the scans and discussed with the patient. DG Bone Survey Met  Result Date:  11/05/2020 CLINICAL DATA:  Elevated total protein in the urine. EXAM: METASTATIC BONE SURVEY COMPARISON:  CT imaging from 2020 of the head and spine. FINDINGS: Calvarium with signs of previous craniectomy in RIGHT frontal region attack relate spacers noted on previous CT imaging accounting for lucency in this area. RIGHT LEFT upper extremities and shoulders without signs of lytic process. Cervical spine with degenerative changes.  No visible lytic change. Thoracic spine with signs of degenerative change. No visible lytic changes. Lumbar spine also with degenerative changes and without signs of lytic change. Grade 1 anterolisthesis of L4 on L5 and marked facet arthropathy. Imaging of the chest grossly unremarkable. No signs of lytic process involving the bony thorax by radiography. No lytic changes of the bony pelvis or lower extremities. IMPRESSION: Post craniectomy. No signs of focal lytic lesion to suggest myeloma. Spinal degenerative changes as described. Electronically Signed   By: Zetta Bills M.D.   On: 11/05/2020 09:53     ASSESSMENT:  Elevated total protein: - Patient seen at the request of [facility] for elevated total protein. - Labs from 10/24/2020 with total protein 8.8, 9.3 (06/08/2020), 9.1 (05/24/2020), 8.9 (11/19/2019), 8.9 (05/18/2019). - CBC showed mild elevated platelet count ranging between 407 and 455. - Calcium was slightly elevated at 10.6 on 10/24/2020.  SPEP was negative on 06/27/2020. - She reports right thigh pain for the last 3 weeks, aching type on and off, worse on standing and walking.  Also reports worsening energy levels. - Denies any fevers, night sweats or weight loss.  Denies any infections.  Tingling and numbness in the right hand due to carpal tunnel. 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  No prior history of blood transfusion.  Had 1 episode of miscarriage.  Also reports questionable left leg SVT.    Social/family history: - She retired from [employer] and used to live and work in [place], [place].  Recently moved to [place] as her mother lives in [place].  Quit smoking in November 2019.  She used to smoke skinny cigar daily prior to that. - No family history of cancer.  No family history of myeloma or leukemia.   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  SPEP was negative.  Immunofixation shows polyclonal increase in immunoglobulins. - Kappa light chains are elevated at 111 and lambda light chains 20.  Ratio is 5.38.  Skeletal survey did not show any lytic lesions. - Hepatitis B and C serology was negative. - She does not have any "crab" features.  We will have to closely monitor her free light chain ratio which was elevated. - RTC 3 months with repeat labs.  I will also check 24-hour urine for UPEP, free light chains, immunofixation.   Mild hypercalcemia: - We discussed the results of intact PTH which was 17.  25-hydroxy vitamin D was 61.  1,25 dihydroxy vitamin D was 73. - Calcium has normalized at 9.3.  Orders placed this encounter:  No orders of the defined types were placed in this encounter.    Derek Jack, MD [place] [phone]   I, Thana Ates, am acting as a scribe for Dr. Derek Jack.  I, Derek Jack MD, have reviewed the above documentation for accuracy and completeness, and I agree with the above.

## 2020-11-21 ENCOUNTER — Inpatient Hospital Stay (HOSPITAL_COMMUNITY): Payer: Federal, State, Local not specified - PPO | Attending: Hematology | Admitting: Hematology

## 2020-11-21 ENCOUNTER — Other Ambulatory Visit: Payer: Self-pay

## 2020-11-21 VITALS — BP 134/88 | HR 103 | Temp 98.1°F | Resp 18 | Wt 234.0 lb

## 2020-11-21 DIAGNOSIS — R768 Other specified abnormal immunological findings in serum: Secondary | ICD-10-CM | POA: Insufficient documentation

## 2020-11-21 DIAGNOSIS — R779 Abnormality of plasma protein, unspecified: Secondary | ICD-10-CM

## 2020-11-21 DIAGNOSIS — D890 Polyclonal hypergammaglobulinemia: Secondary | ICD-10-CM | POA: Diagnosis not present

## 2020-11-21 DIAGNOSIS — Z7982 Long term (current) use of aspirin: Secondary | ICD-10-CM | POA: Insufficient documentation

## 2020-11-21 DIAGNOSIS — Z79899 Other long term (current) drug therapy: Secondary | ICD-10-CM | POA: Diagnosis not present

## 2020-11-21 NOTE — Patient Instructions (Signed)
[facility] at [facility] Discharge Instructions  You were seen and examined today by Dr. Ellin Saba. Dr. Ellin Saba reviewed your recent lab results. Your light chains were elevated. Dr. Ellin Saba will recheck your labs in 3 months in addition to a 24-hour urine test.  Follow-up as scheduled.   Thank you for choosing [facility] at [facility] to provide your oncology and hematology care.  To afford each patient quality time with our provider, please arrive at least 15 minutes before your scheduled appointment time.   If you have a lab appointment with the Cancer Center please come in thru the Main Entrance and check in at the main information desk.  You need to re-schedule your appointment should you arrive 10 or more minutes late.  We strive to give you quality time with our providers, and arriving late affects you and other patients whose appointments are after yours.  Also, if you no show three or more times for appointments you may be dismissed from the clinic at the providers discretion.     Again, thank you for choosing [facility].  Our hope is that these requests will decrease the amount of time that you wait before being seen by our physicians.       _____________________________________________________________  Should you have questions after your visit to [facility], please contact our office at [phone] and follow the prompts.  Our office hours are 8:00 a.m. and 4:30 p.m. Monday - Friday.  Please note that voicemails left after 4:00 p.m. may not be returned until the following business day.  We are closed weekends and major holidays.  You do have access to a nurse 24-7, just call the main number to the clinic [phone] and do not press any options, hold on the line and a nurse will answer the phone.    For prescription refill requests, have your pharmacy contact our office and allow 72 hours.    Due to  Covid, you will need to wear a mask upon entering the hospital. If you do not have a mask, a mask will be given to you at the Main Entrance upon arrival. For doctor visits, patients may have 1 support person age 31 or older with them. For treatment visits, patients can not have anyone with them due to social distancing guidelines and our immunocompromised population.

## 2021-02-07 DIAGNOSIS — N1831 Chronic kidney disease, stage 3a: Secondary | ICD-10-CM | POA: Diagnosis not present

## 2021-02-07 DIAGNOSIS — I10 Essential (primary) hypertension: Secondary | ICD-10-CM | POA: Diagnosis not present

## 2021-02-07 DIAGNOSIS — D890 Polyclonal hypergammaglobulinemia: Secondary | ICD-10-CM | POA: Diagnosis not present

## 2021-02-07 DIAGNOSIS — R7303 Prediabetes: Secondary | ICD-10-CM | POA: Diagnosis not present

## 2021-02-14 ENCOUNTER — Inpatient Hospital Stay (HOSPITAL_COMMUNITY): Payer: Federal, State, Local not specified - PPO | Attending: Hematology

## 2021-02-14 ENCOUNTER — Other Ambulatory Visit: Payer: Self-pay

## 2021-02-14 DIAGNOSIS — Z79899 Other long term (current) drug therapy: Secondary | ICD-10-CM | POA: Diagnosis not present

## 2021-02-14 DIAGNOSIS — Z87891 Personal history of nicotine dependence: Secondary | ICD-10-CM | POA: Diagnosis not present

## 2021-02-14 DIAGNOSIS — R779 Abnormality of plasma protein, unspecified: Secondary | ICD-10-CM | POA: Diagnosis not present

## 2021-02-14 LAB — CBC WITH DIFFERENTIAL/PLATELET
Abs Immature Granulocytes: 0.02 10*3/uL (ref 0.00–0.07)
Basophils Absolute: 0 10*3/uL (ref 0.0–0.1)
Basophils Relative: 1 %
Eosinophils Absolute: 0 10*3/uL (ref 0.0–0.5)
Eosinophils Relative: 0 %
HCT: 45.2 % (ref 36.0–46.0)
Hemoglobin: 14.7 g/dL (ref 12.0–15.0)
Immature Granulocytes: 0 %
Lymphocytes Relative: 46 %
Lymphs Abs: 2.6 10*3/uL (ref 0.7–4.0)
MCH: 29.4 pg (ref 26.0–34.0)
MCHC: 32.5 g/dL (ref 30.0–36.0)
MCV: 90.4 fL (ref 80.0–100.0)
Monocytes Absolute: 0.6 10*3/uL (ref 0.1–1.0)
Monocytes Relative: 11 %
Neutro Abs: 2.4 10*3/uL (ref 1.7–7.7)
Neutrophils Relative %: 42 %
Platelets: 368 10*3/uL (ref 150–400)
RBC: 5 MIL/uL (ref 3.87–5.11)
RDW: 13 % (ref 11.5–15.5)
WBC: 5.6 10*3/uL (ref 4.0–10.5)
nRBC: 0 % (ref 0.0–0.2)

## 2021-02-14 LAB — COMPREHENSIVE METABOLIC PANEL
ALT: 21 U/L (ref 0–44)
AST: 19 U/L (ref 15–41)
Albumin: 4.3 g/dL (ref 3.5–5.0)
Alkaline Phosphatase: 50 U/L (ref 38–126)
Anion gap: 8 (ref 5–15)
BUN: 10 mg/dL (ref 6–20)
CO2: 27 mmol/L (ref 22–32)
Calcium: 9.8 mg/dL (ref 8.9–10.3)
Chloride: 103 mmol/L (ref 98–111)
Creatinine, Ser: 0.97 mg/dL (ref 0.44–1.00)
GFR, Estimated: >60 mL/min (ref >60)
Glucose, Bld: 84 mg/dL (ref 70–99)
Potassium: 3.5 mmol/L (ref 3.5–5.1)
Sodium: 138 mmol/L (ref 135–145)
Total Bilirubin: 0.5 mg/dL (ref 0.3–1.2)
Total Protein: 9.6 g/dL — ABNORMAL HIGH (ref 6.5–8.1)

## 2021-02-15 LAB — KAPPA/LAMBDA LIGHT CHAINS
Kappa free light chain: 80.4 mg/L — ABNORMAL HIGH (ref 3.3–19.4)
Kappa, lambda light chain ratio: 4.02 — ABNORMAL HIGH (ref 0.26–1.65)
Lambda free light chains: 20 mg/L (ref 5.7–26.3)

## 2021-02-16 DIAGNOSIS — M25551 Pain in right hip: Secondary | ICD-10-CM | POA: Diagnosis not present

## 2021-02-17 LAB — IMMUNOFIXATION ELECTROPHORESIS
IgA: 207 mg/dL (ref 87–352)
IgG (Immunoglobin G), Serum: 2921 mg/dL — ABNORMAL HIGH (ref 586–1602)
IgM (Immunoglobulin M), Srm: 81 mg/dL (ref 26–217)
Total Protein ELP: 9 g/dL — ABNORMAL HIGH (ref 6.0–8.5)

## 2021-02-21 ENCOUNTER — Inpatient Hospital Stay (HOSPITAL_COMMUNITY): Payer: Federal, State, Local not specified - PPO | Attending: Hematology | Admitting: Hematology

## 2021-02-21 VITALS — BP 168/84 | HR 84 | Temp 98.1°F | Resp 18 | Ht 66.0 in | Wt 236.0 lb

## 2021-02-21 DIAGNOSIS — Z87891 Personal history of nicotine dependence: Secondary | ICD-10-CM | POA: Insufficient documentation

## 2021-02-21 DIAGNOSIS — R779 Abnormality of plasma protein, unspecified: Secondary | ICD-10-CM | POA: Diagnosis not present

## 2021-02-21 DIAGNOSIS — Z79899 Other long term (current) drug therapy: Secondary | ICD-10-CM | POA: Insufficient documentation

## 2021-02-21 NOTE — Patient Instructions (Signed)
[place] at [facility] Discharge Instructions  You were seen and examined today by Dr. Delton Coombes. He reviewed your most recent labs. Please complete 24 hr urine the day before you come for labs. Please keep follow up appointment as scheduled in 6 months.    Thank you for choosing [place] at [facility] to provide your oncology and hematology care.  To afford each patient quality time with our provider, please arrive at least 15 minutes before your scheduled appointment time.   If you have a lab appointment with the [place] please come in thru the Main Entrance and check in at the main information desk.  You need to re-schedule your appointment should you arrive 10 or more minutes late.  We strive to give you quality time with our providers, and arriving late affects you and other patients whose appointments are after yours.  Also, if you no show three or more times for appointments you may be dismissed from the clinic at the providers discretion.     Again, thank you for choosing [facility].  Our hope is that these requests will decrease the amount of time that you wait before being seen by our physicians.       _____________________________________________________________  Should you have questions after your visit to [facility], please contact our office at [phone] and follow the prompts.  Our office hours are 8:00 a.m. and 4:30 p.m. Monday - Friday.  Please note that voicemails left after 4:00 p.m. may not be returned until the following business day.  We are closed weekends and major holidays.  You do have access to a nurse 24-7, just call the main number to the clinic [phone] and do not press any options, hold on the line and a nurse will answer the phone.    For prescription refill requests, have your pharmacy contact our office and allow 72 hours.    Due to Covid, you will need to wear a mask  upon entering the hospital. If you do not have a mask, a mask will be given to you at the Main Entrance upon arrival. For doctor visits, patients may have 1 support person age 85 or older with them. For treatment visits, patients can not have anyone with them due to social distancing guidelines and our immunocompromised population.

## 2021-02-21 NOTE — Progress Notes (Signed)
[name],  [address]   CLINIC:  Medical Oncology/Hematology  PCP:  Ephriam Jenkins, PA (Inactive) [address] [address]  [phone]  REASON FOR VISIT:  Follow-up for elevated total protein  PRIOR THERAPY: none  CURRENT THERAPY: surveillance  INTERVAL HISTORY:  Ruth Johnston, a 61 y.o. female, returns for routine follow-up for her elevated total protein. Ruth Johnston was last seen on 11/21/2020.  Today she reports feeling well. She denies new pains. She reports continued right knee and hip pain for which she takes tylenol. She will be starting physical therapy on Thursday. She reports she is taking tumeric, ginger, and sea moss supplements.   REVIEW OF SYSTEMS:  Review of Systems  Constitutional:  Positive for fatigue. Negative for appetite change.  Musculoskeletal:  Positive for arthralgias (5/10 R knee and hip).  Neurological:  Positive for numbness (R hand).  Psychiatric/Behavioral:  Positive for sleep disturbance.   All other systems reviewed and are negative.  PAST MEDICAL/SURGICAL HISTORY:  No past medical history on file. Past Surgical History:  Procedure Laterality Date   CEREBRAL ANEURYSM REPAIR N/A 11/2017   CHOLECYSTECTOMY, LAPAROSCOPIC N/A 05/1990    SOCIAL HISTORY:  Social History   Socioeconomic History   Marital status: Single    Spouse name: Not on file   Number of children: Not on file   Years of education: Not on file   Highest education level: Not on file  Occupational History   Not on file  Tobacco Use   Smoking status: Former    Types: Cigars    Quit date: 11/15/2017    Years since quitting: 3.2   Smokeless tobacco: Never  Substance and Sexual Activity   Alcohol use: Not Currently   Drug use: Never   Sexual activity: Not Currently  Other Topics Concern   Not on file  Social History Narrative   Not on file   Social Determinants of Health   Financial Resource  Strain: Low Risk    Difficulty of Paying Living Expenses: Not hard at all  Food Insecurity: No Food Insecurity   Worried About Charity fundraiser in the Last Year: Never true   [place] in the Last Year: Never true  Transportation Needs: No Transportation Needs   Lack of Transportation (Medical): No   Lack of Transportation (Non-Medical): No  Physical Activity: Insufficiently Active   Days of Exercise per Week: 3 days   Minutes of Exercise per Session: 30 min  Stress: No Stress Concern Present   Feeling of Stress : Only a little  Social Connections: Moderately Integrated   Frequency of Communication with Friends and Family: More than three times a week   Frequency of Social Gatherings with Friends and Family: More than three times a week   Attends Religious Services: More than 4 times per year   Active Member of [employer] or Organizations: Yes   Attends Music therapist: More than 4 times per year   Marital Status: Divorced  Human resources officer Violence: Not At Risk   Fear of Current or Ex-Partner: No   Emotionally Abused: No   Physically Abused: No   Sexually Abused: No    FAMILY HISTORY:  Family History  Problem Relation Age of Onset   Hypertension Mother    Heart failure Father    Hypertension Sister     CURRENT MEDICATIONS:  Current Outpatient Medications  Medication Sig  Dispense Refill   acetaminophen (TYLENOL) 500 MG tablet Take 1,000 mg by mouth every 6 (six) hours as needed.     acetaminophen (TYLENOL) 650 MG CR tablet 3 tablets     amLODipine (NORVASC) 10 MG tablet Take by mouth.     aspirin 81 MG chewable tablet Chew 81 mg by mouth daily.     Black Pepper-Turmeric (TURMERIC CURCUMIN) 05-998 MG CAPS See admin instructions.     omeprazole (PRILOSEC) 40 MG capsule Take 40 mg by mouth every morning.     traZODone (DESYREL) 50 MG tablet Take 50 mg by mouth at bedtime as needed.     valsartan-hydrochlorothiazide (DIOVAN-HCT) 160-25 MG tablet Take 1  tablet by mouth daily.     No current facility-administered medications for this visit.    ALLERGIES:  No Known Allergies  PHYSICAL EXAM:  Performance status (ECOG): 1 - Symptomatic but completely ambulatory  Vitals:   02/21/21 1446  BP: (!) 168/84  Pulse: 84  Resp: 18  Temp: 98.1 F (36.7 C)  SpO2: 98%   Wt Readings from Last 3 Encounters:  02/21/21 236 lb (107 kg)  11/21/20 234 lb (106.1 kg)  11/03/20 240 lb 3.2 oz (109 kg)   Physical Exam Vitals reviewed.  Constitutional:      Appearance: Normal appearance. She is obese.  Cardiovascular:     Rate and Rhythm: Normal rate and regular rhythm.     Pulses: Normal pulses.     Heart sounds: Normal heart sounds.  Pulmonary:     Effort: Pulmonary effort is normal.     Breath sounds: Normal breath sounds.  Neurological:     General: No focal deficit present.     Mental Status: She is alert and oriented to person, place, and time.  Psychiatric:        Mood and Affect: Mood normal.        Behavior: Behavior normal.    LABORATORY DATA:  I have reviewed the labs as listed.  CBC Latest Ref Rng & Units 02/14/2021 11/03/2020  WBC 4.0 - 10.5 K/uL 5.6 5.3  Hemoglobin 12.0 - 15.0 g/dL 14.7 14.3  Hematocrit 36.0 - 46.0 % 45.2 43.7  Platelets 150 - 400 K/uL 368 384   CMP Latest Ref Rng & Units 02/14/2021 11/03/2020 11/03/2020  Glucose 70 - 99 mg/dL 84 83 -  BUN 6 - 20 mg/dL 10 9 -  Creatinine 0.44 - 1.00 mg/dL 0.97 1.00 -  Sodium 135 - 145 mmol/L 138 137 -  Potassium 3.5 - 5.1 mmol/L 3.5 3.4(L) -  Chloride 98 - 111 mmol/L 103 102 -  CO2 22 - 32 mmol/L 27 22 -  Calcium 8.9 - 10.3 mg/dL 9.8 9.3 10.3(H)  Total Protein 6.5 - 8.1 g/dL 9.6(H) 9.7(H) -  Total Bilirubin 0.3 - 1.2 mg/dL 0.5 0.4 -  Alkaline Phos 38 - 126 U/L 50 54 -  AST 15 - 41 U/L 19 26 -  ALT 0 - 44 U/L 21 24 -      Component Value Date/Time   RBC 5.00 02/14/2021 1348   MCV 90.4 02/14/2021 1348   MCH 29.4 02/14/2021 1348   MCHC 32.5 02/14/2021 1348   RDW  13.0 02/14/2021 1348   LYMPHSABS 2.6 02/14/2021 1348   MONOABS 0.6 02/14/2021 1348   EOSABS 0.0 02/14/2021 1348   BASOSABS 0.0 02/14/2021 1348    DIAGNOSTIC IMAGING:  I have independently reviewed the scans and discussed with the patient. No results found.   ASSESSMENT:  Elevated total protein: - Patient seen at the request of [facility] for elevated total protein. - Labs from 10/24/2020 with total protein 8.8, 9.3 (06/08/2020), 9.1 (05/24/2020), 8.9 (11/19/2019), 8.9 (05/18/2019). - CBC showed mild elevated platelet count ranging between 407 and 455. - Calcium was slightly elevated at 10.6 on 10/24/2020.  SPEP was negative on 06/27/2020. - She reports right thigh pain for the last 3 weeks, aching type on and off, worse on standing and walking.  Also reports worsening energy levels. - Denies any fevers, night sweats or weight loss.  Denies any infections.  Tingling and numbness in the right hand due to carpal tunnel. 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  No prior history of blood transfusion.  Had 1 episode of miscarriage.  Also reports questionable left leg SVT.   Social/family history: - She retired from [employer] and used to live and work in [place], [place].  Recently moved to [place] as her mother lives in [place].  Quit smoking in November 2019.  She used to smoke skinny cigar daily prior to that. - No family history of cancer.  No family history of myeloma or leukemia.   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 - She does not report any new onset bone pains. - She reports that she started taking sea moss, turmeric, ginger and black pepper. - We have reviewed labs from 02/14/2021 which showed elevated total protein 9.6 stable.  Rest of the comprehensive metabolic panel is within normal limits.  CBC  was normal.  Free light chain ratio has improved 4.02 from 5.38 in October.  Kappa light chains also improved to 80 from 111.  Immunofixation is polyclonal. - Skeletal survey from 11/05/2020 did not show any lytic lesion. - Recommend follow-up in 6 months with repeat blood work including myeloma panel and 24-hour urine for total protein/UPEP/U FLC/UIFX.   Mild hypercalcemia: - Hypercalcemia has normalized.  Calcium today is 9.8 with almond 4.3.  Orders placed this encounter:  Orders Placed This Encounter  Procedures   CBC with Differential/Platelet   Comprehensive metabolic panel   Protein electrophoresis, serum   Kappa/lambda light chains     Derek Jack, MD [place] [phone]   I, Thana Ates, am acting as a scribe for Dr. Derek Jack.  I, Derek Jack MD, have reviewed the above documentation for accuracy and completeness, and I agree with the above.

## 2021-02-23 DIAGNOSIS — M25551 Pain in right hip: Secondary | ICD-10-CM | POA: Diagnosis not present

## 2021-02-23 DIAGNOSIS — M25651 Stiffness of right hip, not elsewhere classified: Secondary | ICD-10-CM | POA: Diagnosis not present

## 2021-02-23 DIAGNOSIS — M25351 Other instability, right hip: Secondary | ICD-10-CM | POA: Diagnosis not present

## 2021-02-23 DIAGNOSIS — R531 Weakness: Secondary | ICD-10-CM | POA: Diagnosis not present

## 2021-02-28 DIAGNOSIS — M25351 Other instability, right hip: Secondary | ICD-10-CM | POA: Diagnosis not present

## 2021-02-28 DIAGNOSIS — R531 Weakness: Secondary | ICD-10-CM | POA: Diagnosis not present

## 2021-02-28 DIAGNOSIS — M25551 Pain in right hip: Secondary | ICD-10-CM | POA: Diagnosis not present

## 2021-02-28 DIAGNOSIS — M25651 Stiffness of right hip, not elsewhere classified: Secondary | ICD-10-CM | POA: Diagnosis not present

## 2021-03-07 DIAGNOSIS — R531 Weakness: Secondary | ICD-10-CM | POA: Diagnosis not present

## 2021-03-07 DIAGNOSIS — M25651 Stiffness of right hip, not elsewhere classified: Secondary | ICD-10-CM | POA: Diagnosis not present

## 2021-03-07 DIAGNOSIS — M25351 Other instability, right hip: Secondary | ICD-10-CM | POA: Diagnosis not present

## 2021-03-07 DIAGNOSIS — M25551 Pain in right hip: Secondary | ICD-10-CM | POA: Diagnosis not present

## 2021-03-15 DIAGNOSIS — M25651 Stiffness of right hip, not elsewhere classified: Secondary | ICD-10-CM | POA: Diagnosis not present

## 2021-03-15 DIAGNOSIS — M25351 Other instability, right hip: Secondary | ICD-10-CM | POA: Diagnosis not present

## 2021-03-15 DIAGNOSIS — R531 Weakness: Secondary | ICD-10-CM | POA: Diagnosis not present

## 2021-03-15 DIAGNOSIS — M25551 Pain in right hip: Secondary | ICD-10-CM | POA: Diagnosis not present

## 2021-03-16 DIAGNOSIS — M25561 Pain in right knee: Secondary | ICD-10-CM | POA: Diagnosis not present

## 2021-03-16 DIAGNOSIS — M25551 Pain in right hip: Secondary | ICD-10-CM | POA: Diagnosis not present

## 2021-03-28 IMAGING — US US EXTREM LOW VENOUS*L*
1 series · 13 of 24 positions shown · non-contrast
Comparison: None.

CLINICAL DATA: Left calf pain. History of previous DVT. Evaluate
for acute or chronic DVT.



[Series 1: us extrem low venous*left* · 0.07mm/px · 13 of 43 slices shown]
[im 1/43]
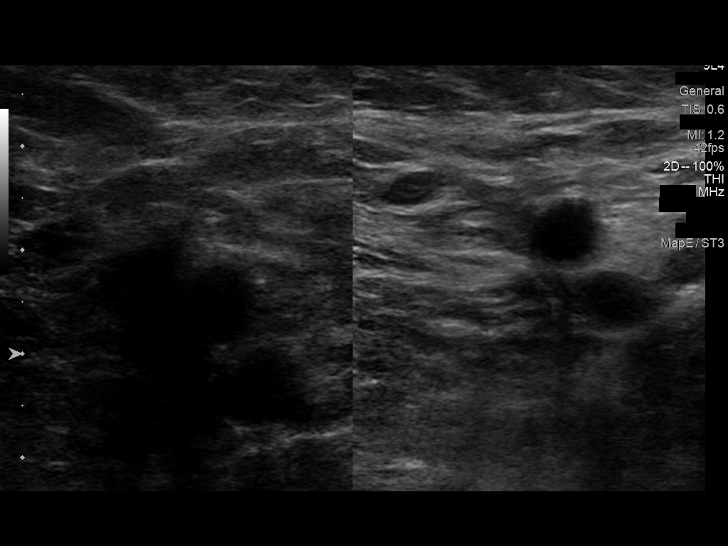
[im 4/43]
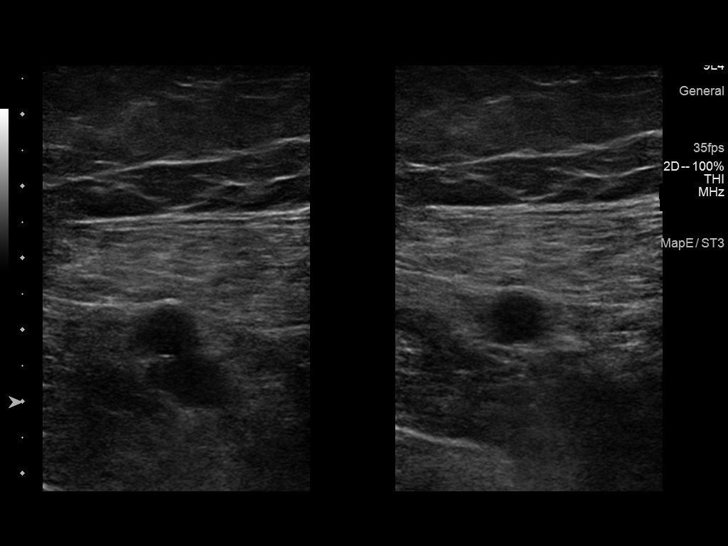
[im 8/43]
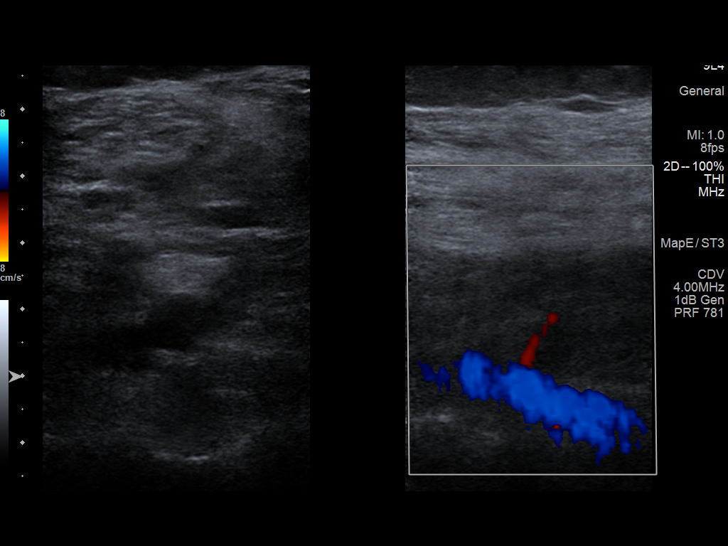
[im 11/43]
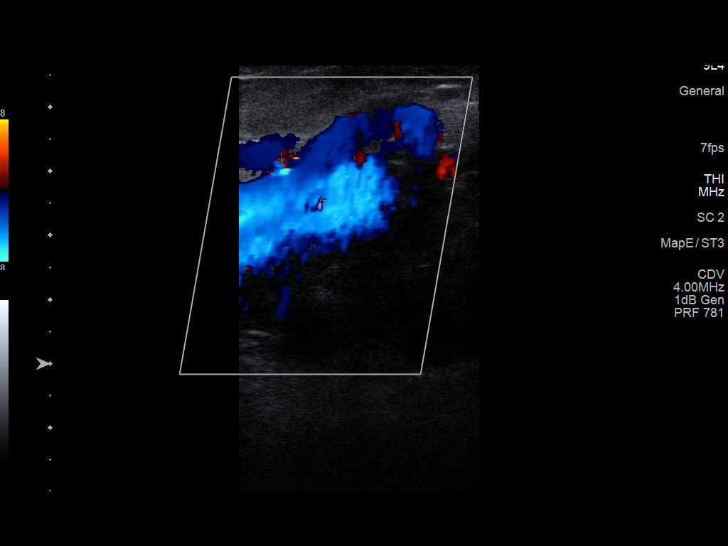
[im 15/43]
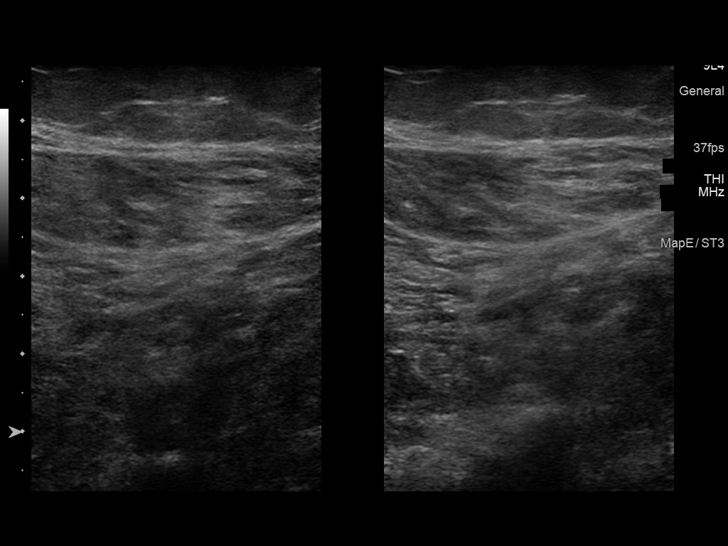
[im 19/43]
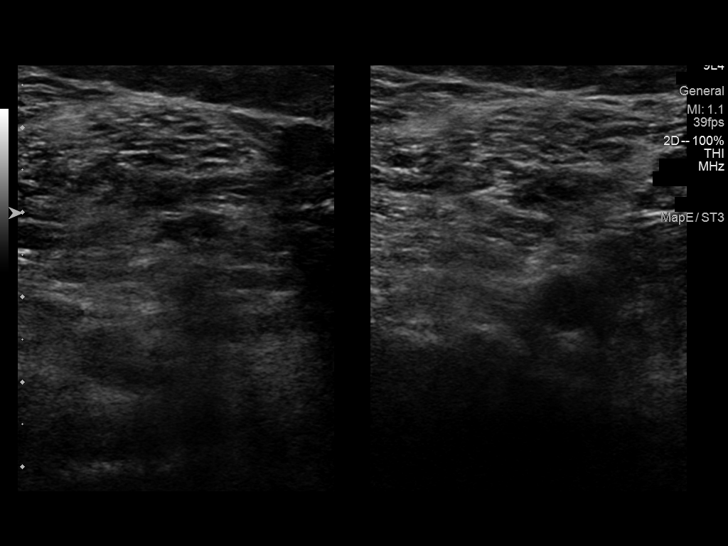
[im 22/43]
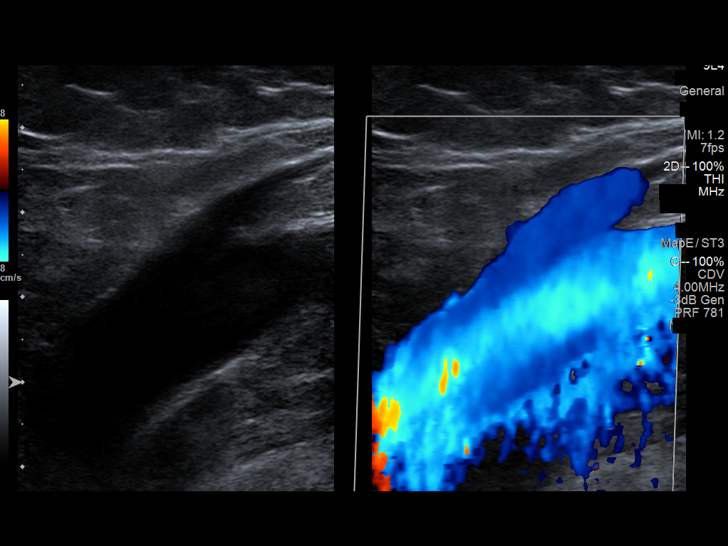
[im 24/43]
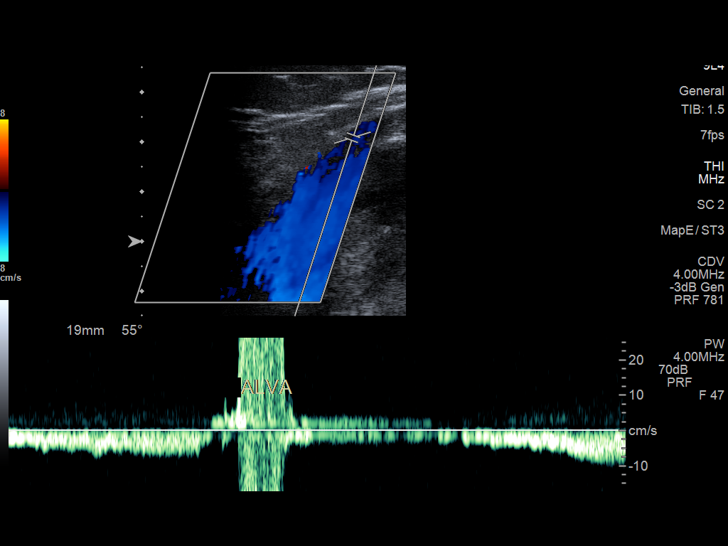
[im 28/43]
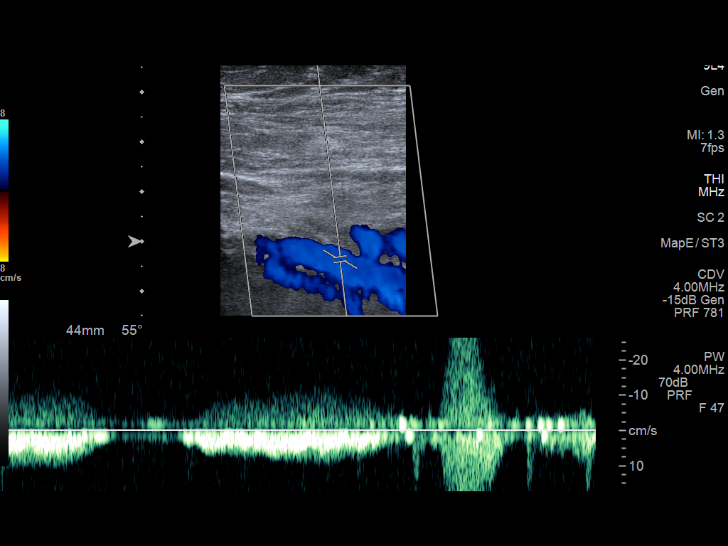
[im 32/43]
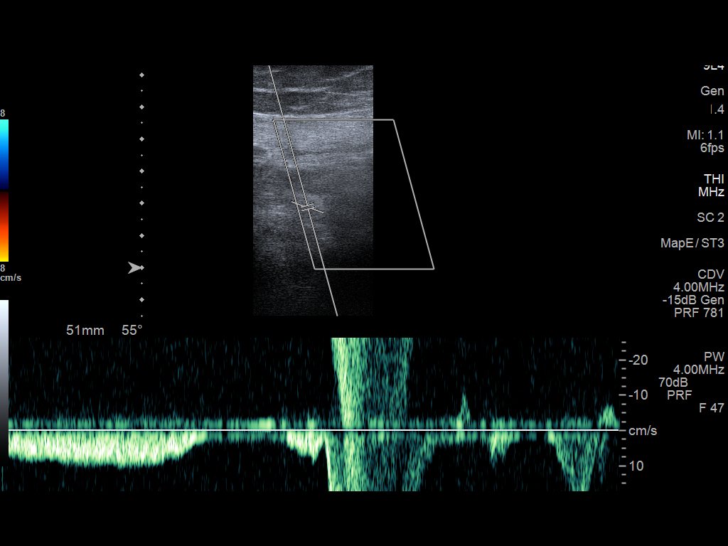
[im 35/43]
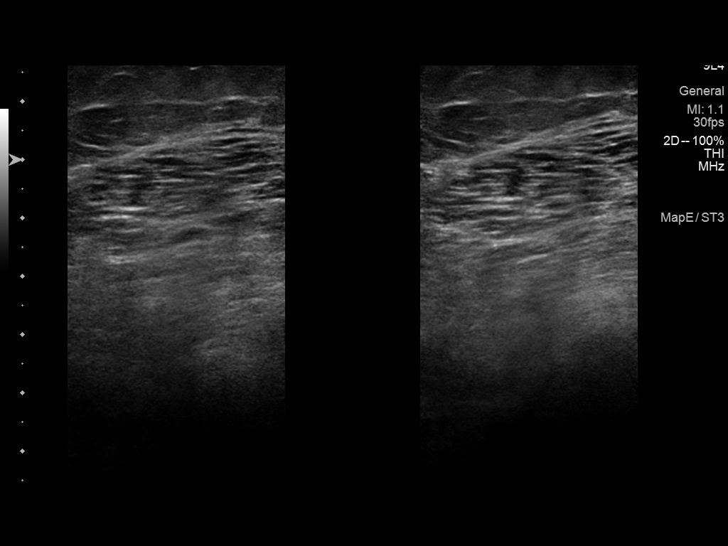
[im 39/43]
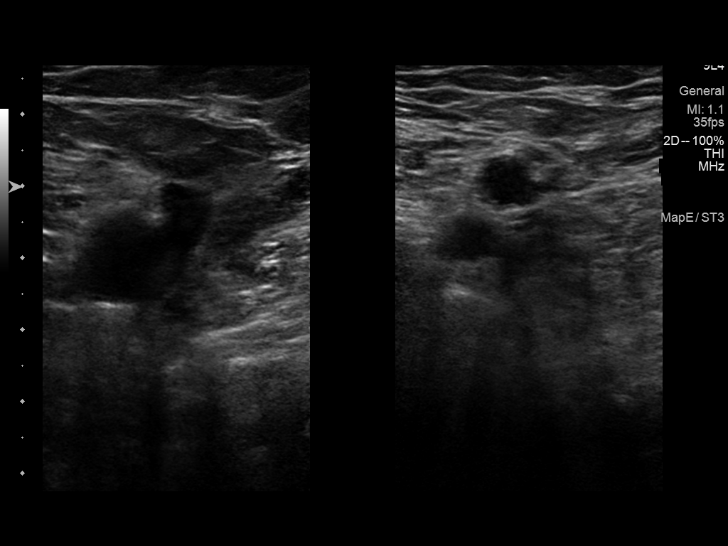
[im 43/43]
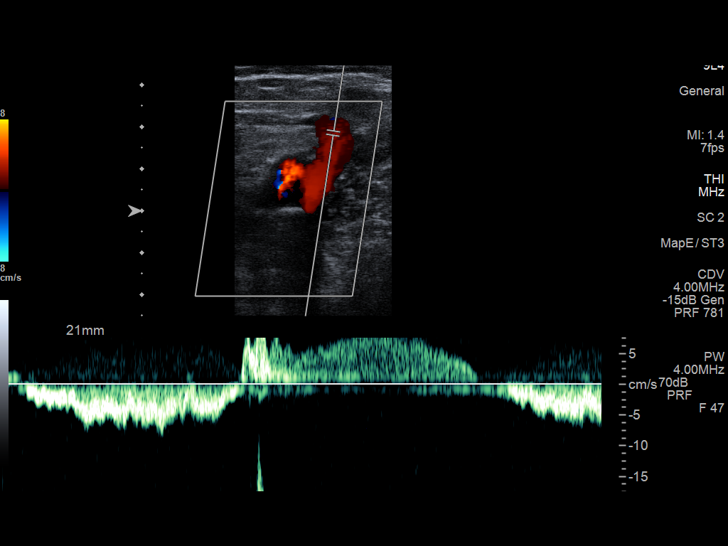

[13 of 24 positions shown; findings below may reference images not displayed]

FINDINGS: Contralateral Common Femoral Vein: Respiratory phasicity is normal
and symmetric with the symptomatic side. No evidence of thrombus.
Normal compressibility.

Common Femoral Vein: No evidence of thrombus. Normal
compressibility, respiratory phasicity and response to augmentation.

Saphenofemoral Junction: No evidence of thrombus. Normal
compressibility and flow on color Doppler imaging.

Profunda Femoral Vein: No evidence of thrombus. Normal
compressibility and flow on color Doppler imaging.

Femoral Vein: No evidence of thrombus. Normal compressibility,
respiratory phasicity and response to augmentation.

Popliteal Vein: No evidence of thrombus. Normal compressibility,
respiratory phasicity and response to augmentation.

Calf Veins: No evidence of acute or chronic thrombus. Normal
compressibility and flow on color Doppler imaging.

Superficial Great Saphenous Vein: No evidence of thrombus. Normal
compressibility.

Venous Reflux:  None.

Other Findings:  None.
IMPRESSION: No evidence of acute or chronic DVT within the left lower extremity.

## 2021-03-29 DIAGNOSIS — M25651 Stiffness of right hip, not elsewhere classified: Secondary | ICD-10-CM | POA: Diagnosis not present

## 2021-03-29 DIAGNOSIS — R531 Weakness: Secondary | ICD-10-CM | POA: Diagnosis not present

## 2021-03-29 DIAGNOSIS — M25351 Other instability, right hip: Secondary | ICD-10-CM | POA: Diagnosis not present

## 2021-03-29 DIAGNOSIS — M25551 Pain in right hip: Secondary | ICD-10-CM | POA: Diagnosis not present

## 2021-04-05 DIAGNOSIS — M25651 Stiffness of right hip, not elsewhere classified: Secondary | ICD-10-CM | POA: Diagnosis not present

## 2021-04-05 DIAGNOSIS — R531 Weakness: Secondary | ICD-10-CM | POA: Diagnosis not present

## 2021-04-05 DIAGNOSIS — M25351 Other instability, right hip: Secondary | ICD-10-CM | POA: Diagnosis not present

## 2021-04-05 DIAGNOSIS — M25551 Pain in right hip: Secondary | ICD-10-CM | POA: Diagnosis not present

## 2021-04-10 DIAGNOSIS — M25551 Pain in right hip: Secondary | ICD-10-CM | POA: Diagnosis not present

## 2021-04-10 DIAGNOSIS — M25651 Stiffness of right hip, not elsewhere classified: Secondary | ICD-10-CM | POA: Diagnosis not present

## 2021-04-10 DIAGNOSIS — R531 Weakness: Secondary | ICD-10-CM | POA: Diagnosis not present

## 2021-04-10 DIAGNOSIS — M25351 Other instability, right hip: Secondary | ICD-10-CM | POA: Diagnosis not present

## 2021-04-17 DIAGNOSIS — R531 Weakness: Secondary | ICD-10-CM | POA: Diagnosis not present

## 2021-04-17 DIAGNOSIS — M25551 Pain in right hip: Secondary | ICD-10-CM | POA: Diagnosis not present

## 2021-04-17 DIAGNOSIS — M25351 Other instability, right hip: Secondary | ICD-10-CM | POA: Diagnosis not present

## 2021-04-17 DIAGNOSIS — M25651 Stiffness of right hip, not elsewhere classified: Secondary | ICD-10-CM | POA: Diagnosis not present

## 2021-04-27 DIAGNOSIS — M25651 Stiffness of right hip, not elsewhere classified: Secondary | ICD-10-CM | POA: Diagnosis not present

## 2021-04-27 DIAGNOSIS — M25351 Other instability, right hip: Secondary | ICD-10-CM | POA: Diagnosis not present

## 2021-04-27 DIAGNOSIS — R531 Weakness: Secondary | ICD-10-CM | POA: Diagnosis not present

## 2021-04-27 DIAGNOSIS — M25551 Pain in right hip: Secondary | ICD-10-CM | POA: Diagnosis not present

## 2021-05-08 DIAGNOSIS — M25651 Stiffness of right hip, not elsewhere classified: Secondary | ICD-10-CM | POA: Diagnosis not present

## 2021-05-08 DIAGNOSIS — M25351 Other instability, right hip: Secondary | ICD-10-CM | POA: Diagnosis not present

## 2021-05-08 DIAGNOSIS — R531 Weakness: Secondary | ICD-10-CM | POA: Diagnosis not present

## 2021-05-08 DIAGNOSIS — M25551 Pain in right hip: Secondary | ICD-10-CM | POA: Diagnosis not present

## 2021-05-17 DIAGNOSIS — M25651 Stiffness of right hip, not elsewhere classified: Secondary | ICD-10-CM | POA: Diagnosis not present

## 2021-05-17 DIAGNOSIS — R531 Weakness: Secondary | ICD-10-CM | POA: Diagnosis not present

## 2021-05-17 DIAGNOSIS — M25551 Pain in right hip: Secondary | ICD-10-CM | POA: Diagnosis not present

## 2021-05-17 DIAGNOSIS — M25351 Other instability, right hip: Secondary | ICD-10-CM | POA: Diagnosis not present

## 2021-05-24 DIAGNOSIS — M25651 Stiffness of right hip, not elsewhere classified: Secondary | ICD-10-CM | POA: Diagnosis not present

## 2021-05-24 DIAGNOSIS — M25551 Pain in right hip: Secondary | ICD-10-CM | POA: Diagnosis not present

## 2021-05-24 DIAGNOSIS — R531 Weakness: Secondary | ICD-10-CM | POA: Diagnosis not present

## 2021-05-24 DIAGNOSIS — M25351 Other instability, right hip: Secondary | ICD-10-CM | POA: Diagnosis not present

## 2021-05-26 DIAGNOSIS — E119 Type 2 diabetes mellitus without complications: Secondary | ICD-10-CM | POA: Diagnosis not present

## 2021-05-26 DIAGNOSIS — Z0000 Encounter for general adult medical examination without abnormal findings: Secondary | ICD-10-CM | POA: Diagnosis not present

## 2021-05-26 DIAGNOSIS — I10 Essential (primary) hypertension: Secondary | ICD-10-CM | POA: Diagnosis not present

## 2021-05-26 DIAGNOSIS — E785 Hyperlipidemia, unspecified: Secondary | ICD-10-CM | POA: Diagnosis not present

## 2021-05-26 DIAGNOSIS — E669 Obesity, unspecified: Secondary | ICD-10-CM | POA: Diagnosis not present

## 2021-05-30 DIAGNOSIS — R531 Weakness: Secondary | ICD-10-CM | POA: Diagnosis not present

## 2021-05-30 DIAGNOSIS — M25351 Other instability, right hip: Secondary | ICD-10-CM | POA: Diagnosis not present

## 2021-05-30 DIAGNOSIS — M25551 Pain in right hip: Secondary | ICD-10-CM | POA: Diagnosis not present

## 2021-05-30 DIAGNOSIS — M25651 Stiffness of right hip, not elsewhere classified: Secondary | ICD-10-CM | POA: Diagnosis not present

## 2021-06-07 DIAGNOSIS — R531 Weakness: Secondary | ICD-10-CM | POA: Diagnosis not present

## 2021-06-07 DIAGNOSIS — M25651 Stiffness of right hip, not elsewhere classified: Secondary | ICD-10-CM | POA: Diagnosis not present

## 2021-06-07 DIAGNOSIS — M25351 Other instability, right hip: Secondary | ICD-10-CM | POA: Diagnosis not present

## 2021-06-07 DIAGNOSIS — M25551 Pain in right hip: Secondary | ICD-10-CM | POA: Diagnosis not present

## 2021-06-14 DIAGNOSIS — M25351 Other instability, right hip: Secondary | ICD-10-CM | POA: Diagnosis not present

## 2021-06-14 DIAGNOSIS — M25651 Stiffness of right hip, not elsewhere classified: Secondary | ICD-10-CM | POA: Diagnosis not present

## 2021-06-14 DIAGNOSIS — M25551 Pain in right hip: Secondary | ICD-10-CM | POA: Diagnosis not present

## 2021-06-14 DIAGNOSIS — R531 Weakness: Secondary | ICD-10-CM | POA: Diagnosis not present

## 2021-06-19 DIAGNOSIS — I10 Essential (primary) hypertension: Secondary | ICD-10-CM | POA: Diagnosis not present

## 2021-06-28 DIAGNOSIS — M25651 Stiffness of right hip, not elsewhere classified: Secondary | ICD-10-CM | POA: Diagnosis not present

## 2021-06-28 DIAGNOSIS — R531 Weakness: Secondary | ICD-10-CM | POA: Diagnosis not present

## 2021-06-28 DIAGNOSIS — M25551 Pain in right hip: Secondary | ICD-10-CM | POA: Diagnosis not present

## 2021-06-28 DIAGNOSIS — M25351 Other instability, right hip: Secondary | ICD-10-CM | POA: Diagnosis not present

## 2021-07-12 DIAGNOSIS — M25351 Other instability, right hip: Secondary | ICD-10-CM | POA: Diagnosis not present

## 2021-07-12 DIAGNOSIS — M25651 Stiffness of right hip, not elsewhere classified: Secondary | ICD-10-CM | POA: Diagnosis not present

## 2021-07-12 DIAGNOSIS — M25551 Pain in right hip: Secondary | ICD-10-CM | POA: Diagnosis not present

## 2021-07-12 DIAGNOSIS — R531 Weakness: Secondary | ICD-10-CM | POA: Diagnosis not present

## 2021-08-21 ENCOUNTER — Inpatient Hospital Stay: Payer: Federal, State, Local not specified - PPO | Attending: Hematology

## 2021-08-28 ENCOUNTER — Ambulatory Visit (HOSPITAL_COMMUNITY): Payer: Federal, State, Local not specified - PPO | Admitting: Hematology

## 2021-08-31 ENCOUNTER — Ambulatory Visit: Payer: Federal, State, Local not specified - PPO | Admitting: Hematology

## 2021-09-19 DIAGNOSIS — I10 Essential (primary) hypertension: Secondary | ICD-10-CM | POA: Diagnosis not present

## 2021-09-19 DIAGNOSIS — N1831 Chronic kidney disease, stage 3a: Secondary | ICD-10-CM | POA: Diagnosis not present

## 2021-09-19 DIAGNOSIS — E1122 Type 2 diabetes mellitus with diabetic chronic kidney disease: Secondary | ICD-10-CM | POA: Diagnosis not present

## 2021-09-19 DIAGNOSIS — E785 Hyperlipidemia, unspecified: Secondary | ICD-10-CM | POA: Diagnosis not present

## 2022-04-03 DIAGNOSIS — M25551 Pain in right hip: Secondary | ICD-10-CM | POA: Diagnosis not present

## 2022-04-05 DIAGNOSIS — I10 Essential (primary) hypertension: Secondary | ICD-10-CM | POA: Diagnosis not present

## 2022-04-05 DIAGNOSIS — E785 Hyperlipidemia, unspecified: Secondary | ICD-10-CM | POA: Diagnosis not present

## 2022-04-05 DIAGNOSIS — E1122 Type 2 diabetes mellitus with diabetic chronic kidney disease: Secondary | ICD-10-CM | POA: Diagnosis not present

## 2022-04-05 DIAGNOSIS — E669 Obesity, unspecified: Secondary | ICD-10-CM | POA: Diagnosis not present

## 2022-04-05 DIAGNOSIS — N1831 Chronic kidney disease, stage 3a: Secondary | ICD-10-CM | POA: Diagnosis not present

## 2022-05-30 DIAGNOSIS — E785 Hyperlipidemia, unspecified: Secondary | ICD-10-CM | POA: Diagnosis not present

## 2022-08-02 DIAGNOSIS — Z0000 Encounter for general adult medical examination without abnormal findings: Secondary | ICD-10-CM | POA: Diagnosis not present

## 2022-08-02 DIAGNOSIS — N1831 Chronic kidney disease, stage 3a: Secondary | ICD-10-CM | POA: Diagnosis not present

## 2022-08-02 DIAGNOSIS — I10 Essential (primary) hypertension: Secondary | ICD-10-CM | POA: Diagnosis not present

## 2022-08-02 DIAGNOSIS — E1122 Type 2 diabetes mellitus with diabetic chronic kidney disease: Secondary | ICD-10-CM | POA: Diagnosis not present

## 2022-08-02 DIAGNOSIS — E785 Hyperlipidemia, unspecified: Secondary | ICD-10-CM | POA: Diagnosis not present

## 2022-08-02 DIAGNOSIS — Z23 Encounter for immunization: Secondary | ICD-10-CM | POA: Diagnosis not present

## 2022-08-06 ENCOUNTER — Encounter (INDEPENDENT_AMBULATORY_CARE_PROVIDER_SITE_OTHER): Payer: Self-pay | Admitting: *Deleted

## 2022-08-10 DIAGNOSIS — E669 Obesity, unspecified: Secondary | ICD-10-CM | POA: Diagnosis not present

## 2022-08-10 DIAGNOSIS — Z23 Encounter for immunization: Secondary | ICD-10-CM | POA: Diagnosis not present

## 2022-08-10 DIAGNOSIS — E119 Type 2 diabetes mellitus without complications: Secondary | ICD-10-CM | POA: Diagnosis not present

## 2022-09-07 ENCOUNTER — Telehealth (INDEPENDENT_AMBULATORY_CARE_PROVIDER_SITE_OTHER): Payer: Self-pay | Admitting: *Deleted

## 2022-09-07 DIAGNOSIS — Z1231 Encounter for screening mammogram for malignant neoplasm of breast: Secondary | ICD-10-CM | POA: Diagnosis not present

## 2022-09-07 NOTE — Telephone Encounter (Signed)
Referring MD/PCP: Carilyn Goodpasture, NP  Insurance: Ernie Hew [id number]  Best Phone Number: [phone]  Reason for the colonoscopy screening  Has patient had this procedure before?  Yes, 2015  If so, when, by whom and where?    Is there a family history of colon cancer?  no  Who?  What age when diagnosed?    Is patient diabetic? If yes, Type 1 or Type 2   yes      Does patient have prosthetic heart valve or mechanical valve?  no  Do you have a pacemaker/defibrillator?  no  Has patient ever had endocarditis/atrial fibrillation? no  Has patient had joint replacement within last 12 months?  no  Is patient constipated or do they take laxatives? yes  Does patient have a history of alcohol/drug use?  no  Does patient use oxygen? no  Have you had a stroke/heart attack last 6 mths? no  Do you take medicine for weight loss?  yes  For female patients,: have you had a hysterectomy no                      are you post menopausal no                      do you still have your menstrual cycle no  Do you take any blood-thinning medications such as: (aspirin, warfarin, Plavix, Aggrenox)  yes  If yes we need the name, milligram, dosage and who is prescribing doctor Aspirin 81 mg   Medications: ozempic 0.25 once a week, amlodipine 10 mg daily, atorvastatin 20 mg daily, metoprolol 500 mg twice daily, methocarbamol 750 mg every 8 hours, omeprazole 40 mg daily, divoan/hct 160/25 mg daily, tylenol arthritis 650 mg twice daily  Allergies: nkda  [facility]arm[facility]: [facility]

## 2022-09-07 NOTE — Telephone Encounter (Signed)
Ok to schedule.Need to hold ozempic  Room 2  Thanks,  Vista Lawman, MD Gastroenterology and Hepatology [facility]

## 2022-09-20 NOTE — Telephone Encounter (Signed)
Will call once get Oct schedule

## 2022-10-03 DIAGNOSIS — M25551 Pain in right hip: Secondary | ICD-10-CM | POA: Diagnosis not present

## 2022-10-11 NOTE — Telephone Encounter (Signed)
LMOVM to call back, also will need BMET prior

## 2022-10-12 ENCOUNTER — Telehealth: Payer: Self-pay | Admitting: Internal Medicine

## 2022-10-12 NOTE — Telephone Encounter (Signed)
Pt left a VM yesterday that she was returning a call. [phone]

## 2022-10-12 NOTE — Telephone Encounter (Signed)
See previous encounter

## 2022-10-12 NOTE — Telephone Encounter (Signed)
Pt says she can't schedule until Nov because she will be out of town the month of Oct. Will call back to schedule once we get providers schedule.

## 2022-10-30 NOTE — Telephone Encounter (Signed)
LMTRC

## 2022-11-08 ENCOUNTER — Other Ambulatory Visit: Payer: Self-pay | Admitting: *Deleted

## 2022-11-08 ENCOUNTER — Encounter: Payer: Self-pay | Admitting: *Deleted

## 2022-11-08 DIAGNOSIS — Z1211 Encounter for screening for malignant neoplasm of colon: Secondary | ICD-10-CM

## 2022-11-08 MED ORDER — PEG 3350-KCL-NA BICARB-NACL 420 G PO SOLR: 4000.0000 mL | Freq: Once | ORAL | 0 refills | Status: AC

## 2022-11-08 NOTE — Addendum Note (Signed)
Addended by: Elinor Dodge on: 11/08/2022 12:55 PM   Modules accepted: Orders

## 2022-11-08 NOTE — Telephone Encounter (Signed)
Pt has been scheduled for 11/30/22, instructions sent to pt and prep sent to the pharmacy.

## 2022-11-13 ENCOUNTER — Encounter (INDEPENDENT_AMBULATORY_CARE_PROVIDER_SITE_OTHER): Payer: Self-pay | Admitting: *Deleted

## 2022-11-13 NOTE — Telephone Encounter (Signed)
Referral completed, TCS apt letter sent to PCP

## 2022-11-14 DIAGNOSIS — M1611 Unilateral primary osteoarthritis, right hip: Secondary | ICD-10-CM | POA: Diagnosis not present

## 2022-11-23 DIAGNOSIS — Z1211 Encounter for screening for malignant neoplasm of colon: Secondary | ICD-10-CM | POA: Diagnosis not present

## 2022-11-24 LAB — BASIC METABOLIC PANEL
BUN/Creatinine Ratio: 14 (ref 12–28)
BUN: 13 mg/dL (ref 8–27)
CO2: 23 mmol/L (ref 20–29)
Calcium: 10.1 mg/dL (ref 8.7–10.3)
Chloride: 98 mmol/L (ref 96–106)
Creatinine, Ser: 0.93 mg/dL (ref 0.57–1.00)
Glucose: 94 mg/dL (ref 70–99)
Potassium: 4.2 mmol/L (ref 3.5–5.2)
Sodium: 138 mmol/L (ref 134–144)
eGFR: 70 mL/min/{1.73_m2} (ref >59)

## 2022-11-29 DIAGNOSIS — M25551 Pain in right hip: Secondary | ICD-10-CM | POA: Diagnosis not present

## 2022-11-30 ENCOUNTER — Ambulatory Visit (HOSPITAL_COMMUNITY): Payer: Federal, State, Local not specified - PPO | Admitting: Anesthesiology

## 2022-11-30 ENCOUNTER — Ambulatory Visit (HOSPITAL_COMMUNITY)
Admission: RE | Admit: 2022-11-30 | Discharge: 2022-11-30 | Disposition: A | Discharge status: Home / Self Care | Payer: Federal, State, Local not specified - PPO | Attending: Gastroenterology | Admitting: Gastroenterology

## 2022-11-30 ENCOUNTER — Encounter (HOSPITAL_COMMUNITY)
Admission: RE | Disposition: A | Discharge status: Home / Self Care | Payer: Self-pay | Source: Home / Self Care | Attending: Gastroenterology

## 2022-11-30 ENCOUNTER — Other Ambulatory Visit: Payer: Self-pay

## 2022-11-30 DIAGNOSIS — Z1211 Encounter for screening for malignant neoplasm of colon: Secondary | ICD-10-CM

## 2022-11-30 DIAGNOSIS — I10 Essential (primary) hypertension: Secondary | ICD-10-CM | POA: Insufficient documentation

## 2022-11-30 DIAGNOSIS — K6389 Other specified diseases of intestine: Secondary | ICD-10-CM

## 2022-11-30 DIAGNOSIS — K644 Residual hemorrhoidal skin tags: Secondary | ICD-10-CM

## 2022-11-30 DIAGNOSIS — K5730 Diverticulosis of large intestine without perforation or abscess without bleeding: Secondary | ICD-10-CM

## 2022-11-30 DIAGNOSIS — K219 Gastro-esophageal reflux disease without esophagitis: Secondary | ICD-10-CM | POA: Insufficient documentation

## 2022-11-30 DIAGNOSIS — K648 Other hemorrhoids: Secondary | ICD-10-CM

## 2022-11-30 DIAGNOSIS — K640 First degree hemorrhoids: Secondary | ICD-10-CM

## 2022-11-30 DIAGNOSIS — Z87891 Personal history of nicotine dependence: Secondary | ICD-10-CM | POA: Insufficient documentation

## 2022-11-30 HISTORY — PX: COLONOSCOPY WITH PROPOFOL: SHX5780

## 2022-11-30 LAB — HM COLONOSCOPY

## 2022-11-30 LAB — GLUCOSE, CAPILLARY: Glucose-Capillary: 90 mg/dL (ref 70–99)

## 2022-11-30 SURGERY — COLONOSCOPY WITH PROPOFOL
Anesthesia: General

## 2022-11-30 MED ORDER — PROPOFOL 10 MG/ML IV BOLUS
INTRAVENOUS | Status: DC | PRN
  Administered 2022-11-30: 100 mg via INTRAVENOUS

## 2022-11-30 MED ORDER — LACTATED RINGERS IV SOLN: INTRAVENOUS | Status: DC | PRN

## 2022-11-30 MED ORDER — PROPOFOL 500 MG/50ML IV EMUL
INTRAVENOUS | Status: DC | PRN
  Administered 2022-11-30: 200 ug/kg/min via INTRAVENOUS

## 2022-11-30 NOTE — Transfer of Care (Signed)
Immediate Anesthesia Transfer of Care Note  Patient: Ruth Johnston  Procedure(s) Performed: COLONOSCOPY WITH PROPOFOL  Patient Location: Short Stay  Anesthesia Type:General  Level of Consciousness: awake, alert , oriented, and patient cooperative  Airway & Oxygen Therapy: Patient Spontanous Breathing  Post-op Assessment: Report given to RN, Post -op Vital signs reviewed and stable, and Patient moving all extremities X 4  Post vital signs: Reviewed and stable  Last Vitals:  Vitals Value Taken Time  BP 109/70 11/30/22 1045  Temp 36.4 C 11/30/22 1045  Pulse 71 11/30/22 1045  Resp 20 11/30/22 1045  SpO2 95 % 11/30/22 1045    Last Pain:  Vitals:   11/30/22 1045  TempSrc: Oral  PainSc: 8          Complications: No notable events documented.

## 2022-11-30 NOTE — Op Note (Signed)
[facility] Patient Name: Ruth Johnston Procedure Date: 11/30/2022 10:12 AM MRN: [id number] Date of Birth: 31-Jul-1960 Attending MD: Sanjuan Dame , MD, [id number] CSN: [id number] Age: 62 Admit Type: Outpatient Procedure:                Colonoscopy Indications:              Screening for colorectal malignant neoplasm Providers:                Sanjuan Dame, MD, Edrick Kins, RN, Zena Amos Referring MD:             Sanjuan Dame, MD Medicines:                Monitored Anesthesia Care Complications:            No immediate complications. Estimated Blood Loss:     Estimated blood loss: none. Procedure:                Pre-Anesthesia Assessment:                           - Prior to the procedure, a History and Physical                            was performed, and patient medications and                            allergies were reviewed. The patient's tolerance of                            previous anesthesia was also reviewed. The risks                            and benefits of the procedure and the sedation                            options and risks were discussed with the patient.                            All questions were answered, and informed consent                            was obtained. Prior Anticoagulants: The patient has                            taken no anticoagulant or antiplatelet agents. ASA                            Grade Assessment: II - A patient with mild systemic                            disease. After reviewing the risks and benefits,  the patient was deemed in satisfactory condition to                            undergo the procedure.                           After obtaining informed consent, the colonoscope                            was passed under direct vision. Throughout the                            procedure, the patient's blood pressure, pulse, and                             oxygen saturations were monitored continuously. The                            [id number]) scope was introduced through the                            anus and advanced to the the cecum, identified by                            appendiceal orifice and ileocecal valve. The                            colonoscopy was performed without difficulty. The                            patient tolerated the procedure well. The quality                            of the bowel preparation was evaluated using the                            BBPS [facility] Bowel Preparation Scale) with scores                            of: Right Colon = 3, Transverse Colon = 3 and Left                            Colon = 3 (entire mucosa seen well with no residual                            staining, small fragments of stool or opaque                            liquid). The total BBPS score equals 9. The                            ileocecal valve, appendiceal orifice, and rectum  were photographed. Scope In: 10:21:33 AM Scope Out: 10:42:13 AM Scope Withdrawal Time: 0 hours 15 minutes 29 seconds  Total Procedure Duration: 0 hours 20 minutes 40 seconds  Findings:      The perianal and digital rectal examinations were normal.      Scattered diverticula were found in the left colon.      An area of mild melanosis was found in the descending colon.      Bleeding external and internal hemorrhoids were found during       retroflexion. The hemorrhoids were small. Impression:               - Diverticulosis in the left colon.                           - Melanosis in the colon.                           - Bleeding external and internal hemorrhoids.                           - No specimens collected. Moderate Sedation:      Per Anesthesia Care Recommendation:           - Patient has a contact number available for                            emergencies. The signs and symptoms of potential                             delayed complications were discussed with the                            patient. Return to normal activities tomorrow.                            Written discharge instructions were provided to the                            patient.                           - Resume previous diet.                           - Continue present medications.                           - Repeat colonoscopy in 10 years for screening                            purposes.                           - Return to primary care physician as previously                            scheduled. Procedure Code(s):        --- Professional ---  W2956, Colorectal cancer screening; colonoscopy on                            individual not meeting criteria for high risk Diagnosis Code(s):        --- Professional ---                           Z12.11, Encounter for screening for malignant                            neoplasm of colon                           K64.8, Other hemorrhoids                           K63.89, Other specified diseases of intestine                           K57.30, Diverticulosis of large intestine without                            perforation or abscess without bleeding CPT copyright 2022 American Medical Association. All rights reserved. The codes documented in this report are preliminary and upon coder review may  be revised to meet current compliance requirements. Sanjuan Dame, MD Sanjuan Dame, MD 11/30/2022 10:55:33 AM This report has been signed electronically. Number of Addenda: 0

## 2022-11-30 NOTE — Discharge Instructions (Signed)

## 2022-11-30 NOTE — H&P (Signed)
Primary Care Physician:  Carilyn Goodpasture, NP Primary Gastroenterologist:  Dr. Tasia Catchings  Pre-Procedure History & Physical: HPI:  Ruth Johnston is a 62 y.o. female is here for a colonoscopy for colon cancer screening purposes.  Patient denies any family history of colorectal cancer.  No melena or hematochezia.  No abdominal pain or unintentional weight loss.  No change in bowel habits.  Overall feels well from a GI standpoint.Last colonoscopy 2015   No past medical history on file.  Past Surgical History:  Procedure Laterality Date   CEREBRAL ANEURYSM REPAIR N/A 11/2017   CHOLECYSTECTOMY, LAPAROSCOPIC N/A 05/1990    Prior to Admission medications   Medication Sig Start Date End Date Taking? Authorizing Provider  acetaminophen (TYLENOL) 500 MG tablet Take 1,000 mg by mouth every 6 (six) hours as needed.   Yes [provider]  acetaminophen (TYLENOL) 650 MG CR tablet 3 tablets   Yes [provider]  amLODipine (NORVASC) 10 MG tablet Take by mouth.   Yes [provider]  metformin (FORTAMET) 500 MG (OSM) 24 hr tablet Take 500 mg by mouth 2 (two) times daily.   Yes [provider]  omeprazole (PRILOSEC) 40 MG capsule Take 40 mg by mouth every morning. 10/14/20  Yes [provider]  traZODone (DESYREL) 50 MG tablet Take 50 mg by mouth at bedtime as needed. 10/15/20  Yes [provider]  aspirin 81 MG chewable tablet Chew 81 mg by mouth daily.    [provider]  Black Pepper-Turmeric (TURMERIC CURCUMIN) 05-998 MG CAPS See admin instructions.    [provider]  valsartan-hydrochlorothiazide (DIOVAN-HCT) 160-25 MG tablet Take 1 tablet by mouth daily. 10/11/20   [provider]    Allergies as of 11/08/2022   (No Known Allergies)    Family History  Problem Relation Age of Onset   Hypertension Mother    Heart failure Father    Hypertension Sister     Social History   Socioeconomic History   Marital status:  Single    Spouse name: Not on file   Number of children: Not on file   Years of education: Not on file   Highest education level: Not on file  Occupational History   Not on file  Tobacco Use   Smoking status: Former    Types: Cigars    Quit date: 11/15/2017    Years since quitting: 5.0   Smokeless tobacco: Never  Substance and Sexual Activity   Alcohol use: Not Currently   Drug use: Never   Sexual activity: Not Currently  Other Topics Concern   Not on file  Social History Narrative   Not on file   Social Determinants of Health   Financial Resource Strain: Low Risk  (11/03/2020)   Overall Financial Resource Strain (CARDIA)    Difficulty of Paying Living Expenses: Not hard at all  Food Insecurity: No Food Insecurity (11/03/2020)   Hunger Vital Sign    Worried About Running Out of Food in the Last Year: Never true    Ran Out of Food in the Last Year: Never true  Transportation Needs: No Transportation Needs (11/03/2020)   PRAPARE - Administrator, Civil Service (Medical): No    Lack of Transportation (Non-Medical): No  Physical Activity: Insufficiently Active (11/03/2020)   Exercise Vital Sign    Days of Exercise per Week: 3 days    Minutes of Exercise per Session: 30 min  Stress: No Stress Concern Present (11/03/2020)   [place]  [employer] - Occupational Stress Questionnaire    Feeling of Stress : Only a little  Social Connections: Moderately Integrated (11/03/2020)   Social Connection and Isolation Panel [NHANES]    Frequency of Communication with Friends and Family: More than three times a week    Frequency of Social Gatherings with Friends and Family: More than three times a week    Attends Religious Services: More than 4 times per year    Active Member of [employer] or Organizations: Yes    Attends Engineer, structural: More than 4 times per year    Marital Status: Divorced  Intimate Partner Violence: Not At Risk (11/03/2020)    Humiliation, Afraid, Rape, and Kick questionnaire    Fear of Current or Ex-Partner: No    Emotionally Abused: No    Physically Abused: No    Sexually Abused: No    Review of Systems: See HPI, otherwise negative ROS  Physical Exam: Vital signs in last 24 hours: Temp:  [97.8 F (36.6 C)] 97.8 F (36.6 C) (11/15 0901) Pulse Rate:  [79] 79 (11/15 0901) Resp:  [16] 16 (11/15 0901) BP: (124)/(75) 124/75 (11/15 0901) SpO2:  [99 %] 99 % (11/15 0901)   General:   Alert,  Well-developed, well-nourished, pleasant and cooperative in NAD Head:  Normocephalic and atraumatic. Eyes:  Sclera clear, no icterus.   Conjunctiva pink. Ears:  Normal auditory acuity. Nose:  No deformity, discharge,  or lesions. Msk:  Symmetrical without gross deformities. Normal posture. Extremities:  Without clubbing or edema. Neurologic:  Alert and  oriented x4;  grossly normal neurologically. Skin:  Intact without significant lesions or rashes. Psych:  Alert and cooperative. Normal mood and affect.  Impression/Plan: Ruth Johnston is here for a colonoscopy to be performed for colon cancer screening purposes.  The risks of the procedure including infection, bleed, or perforation as well as benefits, limitations, alternatives and imponderables have been reviewed with the patient. Questions have been answered. All parties agreeable.

## 2022-11-30 NOTE — Anesthesia Preprocedure Evaluation (Addendum)
Anesthesia Evaluation  Patient identified by MRN, date of birth, ID band Patient awake    Reviewed: Allergy & Precautions, H&P , NPO status , Patient's Chart, lab work & pertinent test results, reviewed documented beta blocker date and time   Airway Mallampati: II  TM Distance: >3 FB Neck ROM: full    Dental no notable dental hx. (+) Dental Advisory Given, Teeth Intact   Pulmonary neg pulmonary ROS   Pulmonary exam normal breath sounds clear to auscultation       Cardiovascular Exercise Tolerance: Good hypertension, Normal cardiovascular exam Rhythm:regular Rate:Normal     Neuro/Psych History of cerebral aneurysm - repaired ( according to patient ) negative neurological ROS  negative psych ROS   GI/Hepatic negative GI ROS, Neg liver ROS,GERD  ,,  Endo/Other  negative endocrine ROS    Renal/GU negative Renal ROS  negative genitourinary   Musculoskeletal   Abdominal   Peds  Hematology negative hematology ROS (+)   Anesthesia Other Findings   Reproductive/Obstetrics negative OB ROS                             Anesthesia Physical Anesthesia Plan  ASA: 3  Anesthesia Plan: General   Post-op Pain Management: Minimal or no pain anticipated   Induction: Intravenous  PONV Risk Score and Plan: Propofol infusion  Airway Management Planned: Natural Airway and Nasal Cannula  Additional Equipment: None  Intra-op Plan:   Post-operative Plan:   Informed Consent: I have reviewed the patients History and Physical, chart, labs and discussed the procedure including the risks, benefits and alternatives for the proposed anesthesia with the patient or authorized representative who has indicated his/her understanding and acceptance.     Dental Advisory Given  Plan Discussed with: CRNA  Anesthesia Plan Comments:         Anesthesia Quick Evaluation

## 2022-11-30 NOTE — Anesthesia Postprocedure Evaluation (Signed)
Anesthesia Post Note  Patient: Ruth Johnston  Procedure(s) Performed: COLONOSCOPY WITH PROPOFOL  Patient location during evaluation: PACU Anesthesia Type: General Level of consciousness: awake and alert Pain management: pain level controlled Vital Signs Assessment: post-procedure vital signs reviewed and stable Respiratory status: spontaneous breathing, nonlabored ventilation, respiratory function stable and patient connected to nasal cannula oxygen Cardiovascular status: stable and blood pressure returned to baseline Postop Assessment: no apparent nausea or vomiting Anesthetic complications: no   There were no known notable events for this encounter.   Last Vitals:  Vitals:   11/30/22 0901 11/30/22 1045  BP: 124/75 109/70  Pulse: 79 71  Resp: 16 20  Temp: 36.6 C 36.4 C  SpO2: 99% 95%    Last Pain:  Vitals:   11/30/22 1045  TempSrc: Oral  PainSc: 8                  Nereyda Bowler L Kara Melching

## 2022-12-03 ENCOUNTER — Encounter (INDEPENDENT_AMBULATORY_CARE_PROVIDER_SITE_OTHER): Payer: Self-pay | Admitting: *Deleted

## 2022-12-06 ENCOUNTER — Encounter (HOSPITAL_COMMUNITY): Payer: Self-pay | Admitting: Gastroenterology

## 2022-12-07 DIAGNOSIS — E669 Obesity, unspecified: Secondary | ICD-10-CM | POA: Diagnosis not present

## 2022-12-07 DIAGNOSIS — N1831 Chronic kidney disease, stage 3a: Secondary | ICD-10-CM | POA: Diagnosis not present

## 2022-12-07 DIAGNOSIS — E785 Hyperlipidemia, unspecified: Secondary | ICD-10-CM | POA: Diagnosis not present

## 2022-12-07 DIAGNOSIS — Z01818 Encounter for other preprocedural examination: Secondary | ICD-10-CM | POA: Diagnosis not present

## 2022-12-07 DIAGNOSIS — I10 Essential (primary) hypertension: Secondary | ICD-10-CM | POA: Diagnosis not present

## 2022-12-07 DIAGNOSIS — E1122 Type 2 diabetes mellitus with diabetic chronic kidney disease: Secondary | ICD-10-CM | POA: Diagnosis not present

## 2023-01-01 DIAGNOSIS — M25551 Pain in right hip: Secondary | ICD-10-CM | POA: Diagnosis not present

## 2023-01-03 ENCOUNTER — Ambulatory Visit: Payer: Self-pay | Admitting: Emergency Medicine

## 2023-01-03 DIAGNOSIS — G8929 Other chronic pain: Secondary | ICD-10-CM

## 2023-01-03 NOTE — H&P (Signed)
TOTAL HIP ADMISSION H&P  Patient is admitted for right total hip arthroplasty.  Subjective:  Chief Complaint: right hip pain  HPI: Ruth Johnston, 62 y.o. female, has a history of pain and functional disability in the right hip(s) due to arthritis and patient has failed non-surgical conservative treatments for greater than 12 weeks to include NSAID's and/or analgesics, corticosteriod injections, supervised PT with diminished ADL's post treatment, use of assistive devices, and activity modification.  Onset of symptoms was gradual starting  1-2  years ago with gradually worsening course since that time.The patient noted no past surgery on the right hip(s).  Patient currently rates pain in the right hip at 10 out of 10 with activity. Patient has night pain, worsening of pain with activity and weight bearing, pain that interfers with activities of daily living, and pain with passive range of motion. Patient has evidence of periarticular osteophytes and joint space narrowing by imaging studies. This condition presents safety issues increasing the risk of falls.  There is no current active infection.  Patient Active Problem List   Diagnosis Date Noted   Diverticulosis of colon without hemorrhage 11/30/2022   Grade I hemorrhoids 11/30/2022   Acid reflux 11/03/2020   Essential (primary) hypertension 05/06/2019   Intracranial hemorrhage, spontaneous intraparenchymal, due to cerebral aneurysm, acute (HCC) 05/06/2019   No past medical history on file.  Past Surgical History:  Procedure Laterality Date   CEREBRAL ANEURYSM REPAIR N/A 11/2017   CHOLECYSTECTOMY, LAPAROSCOPIC N/A 05/1990   COLONOSCOPY WITH PROPOFOL N/A 11/30/2022   Procedure: COLONOSCOPY WITH PROPOFOL;  Surgeon: Franky Macho, MD;  Location: [facility];  Service: Endoscopy;  Laterality: N/A;  10:30 am, asa 2    Current Outpatient Medications  Medication Sig Dispense Refill Last Dose/Taking   acetaminophen (TYLENOL) 500 MG tablet  Take 1,000 mg by mouth every 6 (six) hours as needed.      acetaminophen (TYLENOL) 650 MG CR tablet 3 tablets      amLODipine (NORVASC) 10 MG tablet Take by mouth.      aspirin 81 MG chewable tablet Chew 81 mg by mouth daily.      Black Pepper-Turmeric (TURMERIC CURCUMIN) 05-998 MG CAPS See admin instructions.      metformin (FORTAMET) 500 MG (OSM) 24 hr tablet Take 500 mg by mouth 2 (two) times daily.      omeprazole (PRILOSEC) 40 MG capsule Take 40 mg by mouth every morning.      traZODone (DESYREL) 50 MG tablet Take 50 mg by mouth at bedtime as needed.      valsartan-hydrochlorothiazide (DIOVAN-HCT) 160-25 MG tablet Take 1 tablet by mouth daily.      No current facility-administered medications for this visit.   No Known Allergies  Social History   Tobacco Use   Smoking status: Former    Types: Cigars    Quit date: 11/15/2017    Years since quitting: 5.1   Smokeless tobacco: Never  Substance Use Topics   Alcohol use: Not Currently    Family History  Problem Relation Age of Onset   Hypertension Mother    Heart failure Father    Hypertension Sister      Review of Systems  Musculoskeletal:  Positive for arthralgias.  All other systems reviewed and are negative.   Objective:  Physical Exam Constitutional:      General: She is not in acute distress.    Appearance: Normal appearance. She is not ill-appearing.  HENT:     Head: Normocephalic  and atraumatic.     Right Ear: External ear normal.     Left Ear: External ear normal.     Nose: Nose normal.     Mouth/Throat:     Mouth: Mucous membranes are moist.     Pharynx: Oropharynx is clear.  Eyes:     Extraocular Movements: Extraocular movements intact.     Conjunctiva/sclera: Conjunctivae normal.  Cardiovascular:     Rate and Rhythm: Normal rate and regular rhythm.     Pulses: Normal pulses.     Heart sounds: Normal heart sounds.  Pulmonary:     Effort: Pulmonary effort is normal.     Breath sounds: Normal breath  sounds.  Abdominal:     General: Bowel sounds are normal.     Palpations: Abdomen is soft.     Tenderness: There is no abdominal tenderness.  Musculoskeletal:        General: Tenderness present.     Cervical back: Normal range of motion and neck supple.     Comments: TTP over groin, lateral aspect, greater trochanter.   No significant swelling.  No overlying lesions of area of chief complaint.  Decreased strength and ROM due to elicited pain.  Dorsiflexion and plantarflexion intact.  BLE appear grossly neurovascularly intact.  Gait mildly antalgic.   Skin:    General: Skin is warm and dry.  Neurological:     Mental Status: She is alert and oriented to person, place, and time. Mental status is at baseline.  Psychiatric:        Mood and Affect: Mood normal.        Behavior: Behavior normal.     Vital signs in last 24 hours: @VSRANGES @  Labs:   Estimated body mass index is 38.09 kg/m as calculated from the following:   Height as of 11/30/22: 5\' 6"  (1.676 m).   Weight as of 02/21/21: 107 kg.   Imaging Review Plain radiographs demonstrate severe degenerative joint disease of the right hip(s). The bone quality appears to be fair for age and reported activity level.      Assessment/Plan:  End stage arthritis, right hip(s)  The patient history, physical examination, clinical judgement of the provider and imaging studies are consistent with end stage degenerative joint disease of the right hip(s) and total hip arthroplasty is deemed medically necessary. The treatment options including medical management, injection therapy, arthroscopy and arthroplasty were discussed at length. The risks and benefits of total hip arthroplasty were presented and reviewed. The risks due to aseptic loosening, infection, stiffness, dislocation/subluxation,  thromboembolic complications and other imponderables were discussed.  The patient acknowledged the explanation, agreed to proceed with the plan and  consent was signed. Patient is being admitted for inpatient treatment for surgery, pain control, PT, OT, prophylactic antibiotics, VTE prophylaxis, progressive ambulation and ADL's and discharge planning.The patient is planning to be discharged home with outpatient PT.    Patient's anticipated LOS is less than 2 midnights, meeting these requirements: - Younger than 2 - Lives within 1 hour of care - Has a competent adult at home to recover with post-op recover - NO history of  - Chronic pain requiring opiods  - Diabetes  - Coronary Artery Disease  - Heart failure  - Heart attack  - Stroke  - DVT/VTE  - Cardiac arrhythmia  - Respiratory Failure/COPD  - Renal failure  - Anemia  - Advanced Liver disease

## 2023-01-15 NOTE — Patient Instructions (Signed)
 DUE TO COVID-19 ONLY TWO VISITORS  (aged 61 and older)  ARE ALLOWED TO COME WITH YOU AND STAY IN THE WAITING ROOM ONLY DURING PRE OP AND PROCEDURE.   **NO VISITORS ARE ALLOWED IN THE SHORT STAY AREA OR RECOVERY ROOM!!**  IF YOU WILL BE ADMITTED INTO THE HOSPITAL YOU ARE ALLOWED ONLY FOUR SUPPORT PEOPLE DURING VISITATION HOURS ONLY (7 AM -8PM)   The support person(s) must pass our screening, gel in and out, and wear a mask at all times, including in the patient's room. Patients must also wear a mask when staff or their support person are in the room. Visitors GUEST BADGE MUST BE WORN VISIBLY  One adult visitor may remain with you overnight and MUST be in the room by 8 P.M.     Your procedure is scheduled on: 01/28/23   Report to [facility] Main Entrance    Report to admitting at : 7:45 AM   Call this number if you have problems the morning of surgery [phone]   Do not eat food :After Midnight.   After Midnight you may have the following liquids until : 7:00 AM DAY OF SURGERY  Water  Black Coffee (sugar ok, NO MILK/CREAM OR CREAMERS)  Tea (sugar ok, NO MILK/CREAM OR CREAMERS) regular and decaf                             Plain Jell-O (NO RED)                                           Fruit ices (not with fruit pulp, NO RED)                                     Popsicles (NO RED)                                                                  Juice: apple, WHITE grape, WHITE cranberry Sports drinks like Gatorade (NO RED)   The day of surgery:  Drink ONE (1) Pre-Surgery Clear G2 at : 7:00 AM the morning of surgery. Drink in one sitting. Do not sip.  This drink was given to you during your hospital  pre-op appointment visit. Nothing else to drink after completing the  Pre-Surgery Clear Ensure or G2.          If you have questions, please contact your surgeon's office.  FOLLOW ANY ADDITIONAL PRE OP INSTRUCTIONS YOU RECEIVED FROM YOUR SURGEON'S OFFICE!!!   Oral Hygiene  is also important to reduce your risk of infection.                                    Remember - BRUSH YOUR TEETH THE MORNING OF SURGERY WITH YOUR REGULAR TOOTHPASTE  DENTURES WILL BE REMOVED PRIOR TO SURGERY PLEASE DO NOT APPLY Poly grip OR ADHESIVES!!!   Do NOT smoke after Midnight   Take these medicines the morning of surgery  with A SIP OF WATER : amlodipine,omeprazole. How to Manage Your Diabetes Before and After Surgery  Why is it important to control my blood sugar before and after surgery? Improving blood sugar levels before and after surgery helps healing and can limit problems. A way of improving blood sugar control is eating a healthy diet by:  Eating less sugar and carbohydrates  Increasing activity/exercise  Talking with your doctor about reaching your blood sugar goals High blood sugars (greater than 180 mg/dL) can raise your risk of infections and slow your recovery, so you will need to focus on controlling your diabetes during the weeks before surgery. Make sure that the doctor who takes care of your diabetes knows about your planned surgery including the date and location.  How do I manage my blood sugar before surgery? Check your blood sugar at least 4 times a day, starting 2 days before surgery, to make sure that the level is not too high or low. Check your blood sugar the morning of your surgery when you wake up and every 2 hours until you get to the Short Stay unit. If your blood sugar is less than 70 mg/dL, you will need to treat for low blood sugar: Do not take insulin . Treat a low blood sugar (less than 70 mg/dL) with  cup of clear juice (cranberry or apple), 4 glucose tablets, OR glucose gel. Recheck blood sugar in 15 minutes after treatment (to make sure it is greater than 70 mg/dL). If your blood sugar is not greater than 70 mg/dL on recheck, call [phone] for further instructions. Report your blood sugar to the short stay nurse when you get to Short  Stay.  If you are admitted to the hospital after surgery: Your blood sugar will be checked by the staff and you will probably be given insulin  after surgery (instead of oral diabetes medicines) to make sure you have good blood sugar levels. The goal for blood sugar control after surgery is 80-180 mg/dL.   WHAT DO I DO ABOUT MY DIABETES MEDICATION?  THE MORNING OF SURGERY, DO NOT TAKE ANY ORAL DIABETIC MEDICATIONS DAY OF YOUR SURGERY  DO NOT TAKE THE FOLLOWING 7 DAYS PRIOR TO SURGERY: Ozempic, Wegovy, Rybelsus (Semaglutide), Byetta (exenatide), Bydureon (exenatide ER), Victoza, Saxenda (liraglutide), or Trulicity (dulaglutide) Mounjaro (Tirzepatide) Adlyxin (Lixisenatide), Polyethylene Glycol Loxenatide.HOLD ozempic after:01/20/23                              You may not have any metal on your body including hair pins, jewelry, and body piercing             Do not wear make-up, lotions, powders, perfumes/cologne, or deodorant  Do not wear nail polish including gel and S&S, artificial/acrylic nails, or any other type of covering on natural nails including finger and toenails. If you have artificial nails, gel coating, etc. that needs to be removed by a nail salon please have this removed prior to surgery or surgery may need to be canceled/ delayed if the surgeon/ anesthesia feels like they are unable to be safely monitored.   Do not shave  48 hours prior to surgery.    Do not bring valuables to the hospital. [facility] IS NOT             RESPONSIBLE   FOR VALUABLES.   Contacts, glasses, or bridgework may not be worn into surgery.   Bring small overnight bag day of surgery.  DO NOT BRING YOUR HOME MEDICATIONS TO THE HOSPITAL. PHARMACY WILL DISPENSE MEDICATIONS LISTED ON YOUR MEDICATION LIST TO YOU DURING YOUR ADMISSION IN THE HOSPITAL!    Patients discharged on the day of surgery will not be allowed to drive home.  Someone NEEDS to stay with you for the first 24 hours after  anesthesia.   Special Instructions: Bring a copy of your healthcare power of attorney and living will documents         the day of surgery if you haven't scanned them before.              Please read over the following fact sheets you were given: IF YOU HAVE QUESTIONS ABOUT YOUR PRE-OP INSTRUCTIONS PLEASE CALL [phone]      Pre-operative 5 CHG Bath Instructions   You can play a key role in reducing the risk of infection after surgery. Your skin needs to be as free of germs as possible. You can reduce the number of germs on your skin by washing with CHG (chlorhexidine  gluconate) soap before surgery. CHG is an antiseptic soap that kills germs and continues to kill germs even after washing.   DO NOT use if you have an allergy to chlorhexidine /CHG or antibacterial soaps. If your skin becomes reddened or irritated, stop using the CHG and notify one of our RNs at : [phone].   Please shower with the CHG soap starting 4 days before surgery using the following schedule:     Please keep in mind the following:  DO NOT shave, including legs and underarms, starting the day of your first shower.   You may shave your face at any point before/day of surgery.  Place clean sheets on your bed the day you start using CHG soap. Use a clean washcloth (not used since being washed) for each shower. DO NOT sleep with pets once you start using the CHG.   CHG Shower Instructions:  If you choose to wash your hair and private area, wash first with your normal shampoo/soap.  After you use shampoo/soap, rinse your hair and body thoroughly to remove shampoo/soap residue.  Turn the water  OFF and apply about 3 tablespoons (45 ml) of CHG soap to a CLEAN washcloth.  Apply CHG soap ONLY FROM YOUR NECK DOWN TO YOUR TOES (washing for 3-5 minutes)  DO NOT use CHG soap on face, private areas, open wounds, or sores.  Pay special attention to the area where your surgery is being performed.  If you are having back  surgery, having someone wash your back for you may be helpful. Wait 2 minutes after CHG soap is applied, then you may rinse off the CHG soap.  Pat dry with a clean towel  Put on clean clothes/pajamas   If you choose to wear lotion, please use ONLY the CHG-compatible lotions on the back of this paper.     Additional instructions for the day of surgery: DO NOT APPLY any lotions, deodorants, cologne, or perfumes.   Put on clean/comfortable clothes.  Brush your teeth.  Ask your nurse before applying any prescription medications to the skin.   CHG Compatible Lotions   Aveeno Moisturizing lotion  Cetaphil Moisturizing Cream  Cetaphil Moisturizing Lotion  Clairol Herbal Essence Moisturizing Lotion, Dry Skin  Clairol Herbal Essence Moisturizing Lotion, Extra Dry Skin  Clairol Herbal Essence Moisturizing Lotion, Normal Skin  Curel Age Defying Therapeutic Moisturizing Lotion with Alpha Hydroxy  Curel Extreme Care Body Lotion  Curel Soothing Hands Moisturizing Hand  Lotion  Curel Therapeutic Moisturizing Cream, Fragrance-Free  Curel Therapeutic Moisturizing Lotion, Fragrance-Free  Curel Therapeutic Moisturizing Lotion, Original Formula  Eucerin Daily Replenishing Lotion  Eucerin Dry Skin Therapy Plus Alpha Hydroxy Crme  Eucerin Dry Skin Therapy Plus Alpha Hydroxy Lotion  Eucerin Original Crme  Eucerin Original Lotion  Eucerin Plus Crme Eucerin Plus Lotion  Eucerin TriLipid Replenishing Lotion  Keri Anti-Bacterial Hand Lotion  Keri Deep Conditioning Original Lotion Dry Skin Formula Softly Scented  Keri Deep Conditioning Original Lotion, Fragrance Free Sensitive Skin Formula  Keri Lotion Fast Absorbing Fragrance Free Sensitive Skin Formula  Keri Lotion Fast Absorbing Softly Scented Dry Skin Formula  Keri Original Lotion  Keri Skin Renewal Lotion Keri Silky Smooth Lotion  Keri Silky Smooth Sensitive Skin Lotion  Nivea Body Creamy Conditioning Oil  Nivea Body Extra Enriched Lotion   Nivea Body Original Lotion  Nivea Body Sheer Moisturizing Lotion Nivea Crme  Nivea Skin Firming Lotion  NutraDerm 30 Skin Lotion  NutraDerm Skin Lotion  NutraDerm Therapeutic Skin Cream  NutraDerm Therapeutic Skin Lotion  ProShield Protective Hand Cream  Provon moisturizing lotion   Incentive Spirometer  An incentive spirometer is a tool that can help keep your lungs clear and active. This tool measures how well you are filling your lungs with each breath. Taking long deep breaths may help reverse or decrease the chance of developing breathing (pulmonary) problems (especially infection) following: A long period of time when you are unable to move or be active. BEFORE THE PROCEDURE  If the spirometer includes an indicator to show your best effort, your nurse or respiratory therapist will set it to a desired goal. If possible, sit up straight or lean slightly forward. Try not to slouch. Hold the incentive spirometer in an upright position. INSTRUCTIONS FOR USE  Sit on the edge of your bed if possible, or sit up as far as you can in bed or on a chair. Hold the incentive spirometer in an upright position. Breathe out normally. Place the mouthpiece in your mouth and seal your lips tightly around it. Breathe in slowly and as deeply as possible, raising the piston or the ball toward the top of the column. Hold your breath for 3-5 seconds or for as long as possible. Allow the piston or ball to fall to the bottom of the column. Remove the mouthpiece from your mouth and breathe out normally. Rest for a few seconds and repeat Steps 1 through 7 at least 10 times every 1-2 hours when you are awake. Take your time and take a few normal breaths between deep breaths. The spirometer may include an indicator to show your best effort. Use the indicator as a goal to work toward during each repetition. After each set of 10 deep breaths, practice coughing to be sure your lungs are clear. If you have an  incision (the cut made at the time of surgery), support your incision when coughing by placing a pillow or rolled up towels firmly against it. Once you are able to get out of bed, walk around indoors and cough well. You may stop using the incentive spirometer when instructed by your caregiver.  RISKS AND COMPLICATIONS Take your time so you do not get dizzy or light-headed. If you are in pain, you may need to take or ask for pain medication before doing incentive spirometry. It is harder to take a deep breath if you are having pain. AFTER USE Rest and breathe slowly and easily. It can be helpful to keep  track of a log of your progress. Your caregiver can provide you with a simple table to help with this. If you are using the spirometer at home, follow these instructions: SEEK MEDICAL CARE IF:  You are having difficultly using the spirometer. You have trouble using the spirometer as often as instructed. Your pain medication is not giving enough relief while using the spirometer. You develop fever of 100.5 F (38.1 C) or higher. SEEK IMMEDIATE MEDICAL CARE IF:  You cough up bloody sputum that had not been present before. You develop fever of 102 F (38.9 C) or greater. You develop worsening pain at or near the incision site. MAKE SURE YOU:  Understand these instructions. Will watch your condition. Will get help right away if you are not doing well or get worse. Document Released: 05/14/2006 Document Revised: 03/26/2011 Document Reviewed: 07/15/2006 [facility] Patient Information 2014 [place], [place].   ________________________________________________________________________

## 2023-01-17 ENCOUNTER — Encounter (HOSPITAL_COMMUNITY)
Admission: RE | Admit: 2023-01-17 | Discharge: 2023-01-17 | Disposition: A | Discharge status: Home / Self Care | Payer: BC Managed Care – PPO | Source: Ambulatory Visit | Attending: Orthopedic Surgery | Admitting: Orthopedic Surgery

## 2023-01-17 ENCOUNTER — Encounter (HOSPITAL_COMMUNITY): Payer: Self-pay

## 2023-01-17 ENCOUNTER — Other Ambulatory Visit: Payer: Self-pay

## 2023-01-17 VITALS — BP 135/93 | HR 79 | Temp 98.5°F | Ht 66.0 in | Wt 195.0 lb

## 2023-01-17 DIAGNOSIS — G8929 Other chronic pain: Secondary | ICD-10-CM | POA: Insufficient documentation

## 2023-01-17 DIAGNOSIS — I10 Essential (primary) hypertension: Secondary | ICD-10-CM | POA: Diagnosis not present

## 2023-01-17 DIAGNOSIS — M25551 Pain in right hip: Secondary | ICD-10-CM | POA: Diagnosis not present

## 2023-01-17 DIAGNOSIS — Z01818 Encounter for other preprocedural examination: Secondary | ICD-10-CM | POA: Insufficient documentation

## 2023-01-17 DIAGNOSIS — E119 Type 2 diabetes mellitus without complications: Secondary | ICD-10-CM | POA: Insufficient documentation

## 2023-01-17 HISTORY — DX: Type 2 diabetes mellitus without complications: E11.9

## 2023-01-17 HISTORY — DX: Essential (primary) hypertension: I10

## 2023-01-17 HISTORY — DX: Unspecified osteoarthritis, unspecified site: M19.90

## 2023-01-17 LAB — CBC WITH DIFFERENTIAL/PLATELET
Abs Immature Granulocytes: 0.01 10*3/uL (ref 0.00–0.07)
Basophils Absolute: 0 10*3/uL (ref 0.0–0.1)
Basophils Relative: 1 %
Eosinophils Absolute: 0 10*3/uL (ref 0.0–0.5)
Eosinophils Relative: 1 %
HCT: 42.5 % (ref 36.0–46.0)
Hemoglobin: 13.7 g/dL (ref 12.0–15.0)
Immature Granulocytes: 0 %
Lymphocytes Relative: 38 %
Lymphs Abs: 1.6 10*3/uL (ref 0.7–4.0)
MCH: 28.3 pg (ref 26.0–34.0)
MCHC: 32.2 g/dL (ref 30.0–36.0)
MCV: 87.8 fL (ref 80.0–100.0)
Monocytes Absolute: 0.5 10*3/uL (ref 0.1–1.0)
Monocytes Relative: 11 %
Neutro Abs: 2.1 10*3/uL (ref 1.7–7.7)
Neutrophils Relative %: 49 %
Platelets: 361 10*3/uL (ref 150–400)
RBC: 4.84 MIL/uL (ref 3.87–5.11)
RDW: 13.1 % (ref 11.5–15.5)
WBC: 4.3 10*3/uL (ref 4.0–10.5)
nRBC: 0 % (ref 0.0–0.2)

## 2023-01-17 LAB — COMPREHENSIVE METABOLIC PANEL
ALT: 18 U/L (ref 0–44)
AST: 17 U/L (ref 15–41)
Albumin: 4.4 g/dL (ref 3.5–5.0)
Alkaline Phosphatase: 31 U/L — ABNORMAL LOW (ref 38–126)
Anion gap: 7 (ref 5–15)
BUN: 13 mg/dL (ref 8–23)
CO2: 27 mmol/L (ref 22–32)
Calcium: 9.6 mg/dL (ref 8.9–10.3)
Chloride: 102 mmol/L (ref 98–111)
Creatinine, Ser: 0.57 mg/dL (ref 0.44–1.00)
GFR, Estimated: >60 mL/min (ref >60)
Glucose, Bld: 87 mg/dL (ref 70–99)
Potassium: 3.4 mmol/L — ABNORMAL LOW (ref 3.5–5.1)
Sodium: 136 mmol/L (ref 135–145)
Total Bilirubin: 0.7 mg/dL (ref 0.0–1.2)
Total Protein: 9 g/dL — ABNORMAL HIGH (ref 6.5–8.1)

## 2023-01-17 LAB — TYPE AND SCREEN
ABO/RH(D): A POS
Antibody Screen: NEGATIVE

## 2023-01-17 LAB — GLUCOSE, CAPILLARY: Glucose-Capillary: 89 mg/dL (ref 70–99)

## 2023-01-17 LAB — SURGICAL PCR SCREEN
MRSA, PCR: NEGATIVE
Staphylococcus aureus: NEGATIVE

## 2023-01-17 NOTE — Progress Notes (Addendum)
 For Anesthesia: PCP - Cristopher Bottcher, NP  Cardiologist - N/A  Bowel Prep reminder:  Chest x-ray -  EKG - 01/17/23 Stress Test -  ECHO -  Cardiac Cath -  Pacemaker/ICD device last checked: Pacemaker orders received: Device Rep notified:  Spinal Cord Stimulator:N/A  Sleep Study - N/A CPAP -   Fasting Blood Sugar - 90's - 100's Checks Blood Sugar __2___ times a day Date and result of last Hgb A1c-  Last dose of GLP1 agonist- ozempic GLP1 instructions: To hold after: 01/20/23  Last dose of SGLT-2 inhibitors- N/A SGLT-2 instructions:   Blood Thinner Instructions:N/A Aspirin  Instructions: Last Dose:  Activity level: Can go up a flight of stairs and activities of daily living without stopping and without chest pain and/or shortness of breath   Able to exercise without chest pain and/or shortness of breath  Anesthesia review: Hx: DIA,HTN  Patient denies shortness of breath, fever, cough and chest pain at PAT appointment   Patient verbalized understanding of instructions that were given to them at the PAT appointment. Patient was also instructed that they will need to review over the PAT instructions again at home before surgery.

## 2023-01-18 LAB — HEMOGLOBIN A1C
Hgb A1c MFr Bld: 5.8 % — ABNORMAL HIGH (ref 4.8–5.6)
Mean Plasma Glucose: 120 mg/dL

## 2023-01-25 NOTE — Anesthesia Preprocedure Evaluation (Addendum)
 Anesthesia Evaluation  Patient identified by MRN, date of birth, ID band Patient awake    Reviewed: Allergy & Precautions, H&P , NPO status , Patient's Chart, lab work & pertinent test results, reviewed documented beta blocker date and time   Airway Mallampati: II  TM Distance: >3 FB Neck ROM: full    Dental no notable dental hx. (+) Dental Advisory Given, Teeth Intact   Pulmonary former smoker   Pulmonary exam normal breath sounds clear to auscultation       Cardiovascular Exercise Tolerance: Good hypertension, Normal cardiovascular exam Rhythm:regular Rate:Normal     Neuro/Psych History of cerebral aneurysm - repaired ( according to patient )  negative psych ROS   GI/Hepatic negative GI ROS, Neg liver ROS,GERD  ,,  Endo/Other  diabetes    Renal/GU negative Renal ROS  negative genitourinary   Musculoskeletal  (+) Arthritis ,    Abdominal   Peds  Hematology negative hematology ROS (+)   Anesthesia Other Findings   Reproductive/Obstetrics negative OB ROS                              Anesthesia Physical Anesthesia Plan  ASA: 2  Anesthesia Plan: Spinal   Post-op Pain Management:    Induction:   PONV Risk Score and Plan: Treatment may vary due to age or medical condition  Airway Management Planned: Natural Airway  Additional Equipment:   Intra-op Plan:   Post-operative Plan:   Informed Consent: I have reviewed the patients History and Physical, chart, labs and discussed the procedure including the risks, benefits and alternatives for the proposed anesthesia with the patient or authorized representative who has indicated his/her understanding and acceptance.     Dental advisory given  Plan Discussed with: CRNA  Anesthesia Plan Comments:          Anesthesia Quick Evaluation

## 2023-01-28 ENCOUNTER — Ambulatory Visit (HOSPITAL_COMMUNITY): Payer: BC Managed Care – PPO

## 2023-01-28 ENCOUNTER — Ambulatory Visit (HOSPITAL_COMMUNITY): Payer: Self-pay

## 2023-01-28 ENCOUNTER — Encounter (HOSPITAL_COMMUNITY)
Admission: RE | Disposition: A | Discharge status: Home / Self Care | Payer: Self-pay | Source: Home / Self Care | Attending: Orthopedic Surgery

## 2023-01-28 ENCOUNTER — Other Ambulatory Visit: Payer: Self-pay

## 2023-01-28 ENCOUNTER — Ambulatory Visit (HOSPITAL_COMMUNITY)
Admission: RE | Admit: 2023-01-28 | Discharge: 2023-01-28 | Disposition: A | Discharge status: Home / Self Care | Payer: BC Managed Care – PPO | Attending: Orthopedic Surgery | Admitting: Orthopedic Surgery

## 2023-01-28 ENCOUNTER — Encounter (HOSPITAL_COMMUNITY): Payer: Self-pay | Admitting: Orthopedic Surgery

## 2023-01-28 DIAGNOSIS — I10 Essential (primary) hypertension: Secondary | ICD-10-CM | POA: Insufficient documentation

## 2023-01-28 DIAGNOSIS — M1611 Unilateral primary osteoarthritis, right hip: Secondary | ICD-10-CM | POA: Insufficient documentation

## 2023-01-28 DIAGNOSIS — Z87891 Personal history of nicotine dependence: Secondary | ICD-10-CM | POA: Diagnosis not present

## 2023-01-28 DIAGNOSIS — Z96641 Presence of right artificial hip joint: Secondary | ICD-10-CM | POA: Diagnosis not present

## 2023-01-28 DIAGNOSIS — Z7984 Long term (current) use of oral hypoglycemic drugs: Secondary | ICD-10-CM | POA: Insufficient documentation

## 2023-01-28 DIAGNOSIS — E119 Type 2 diabetes mellitus without complications: Secondary | ICD-10-CM | POA: Insufficient documentation

## 2023-01-28 DIAGNOSIS — K219 Gastro-esophageal reflux disease without esophagitis: Secondary | ICD-10-CM | POA: Insufficient documentation

## 2023-01-28 DIAGNOSIS — Z471 Aftercare following joint replacement surgery: Secondary | ICD-10-CM | POA: Diagnosis not present

## 2023-01-28 HISTORY — PX: TOTAL HIP ARTHROPLASTY: SHX124

## 2023-01-28 LAB — GLUCOSE, CAPILLARY
Glucose-Capillary: 111 mg/dL — ABNORMAL HIGH (ref 70–99)
Glucose-Capillary: 113 mg/dL — ABNORMAL HIGH (ref 70–99)

## 2023-01-28 LAB — ABO/RH: ABO/RH(D): A POS

## 2023-01-28 SURGERY — ARTHROPLASTY, HIP, TOTAL,POSTERIOR APPROACH
Anesthesia: Spinal | Site: Hip | Laterality: Right

## 2023-01-28 MED ORDER — KETOROLAC TROMETHAMINE 15 MG/ML IJ SOLN
INTRAMUSCULAR | Status: AC
  Administered 2023-01-28: 15 mg via INTRAVENOUS
  Filled 2023-01-28: qty 1

## 2023-01-28 MED ORDER — PROPOFOL 1000 MG/100ML IV EMUL
INTRAVENOUS | Status: AC
  Filled 2023-01-28: qty 100

## 2023-01-28 MED ORDER — OXYCODONE HCL 5 MG PO TABS: 5.0000 mg | ORAL_TABLET | ORAL | 0 refills | Status: AC | PRN

## 2023-01-28 MED ORDER — ONDANSETRON HCL 4 MG PO TABS: 4.0000 mg | ORAL_TABLET | Freq: Three times a day (TID) | ORAL | 0 refills | Status: AC | PRN

## 2023-01-28 MED ORDER — LACTATED RINGERS IV BOLUS
250.0000 mL | Freq: Once | INTRAVENOUS | Status: DC
Start: 2023-01-28 — End: 2023-01-28

## 2023-01-28 MED ORDER — CEFAZOLIN SODIUM-DEXTROSE 2-4 GM/100ML-% IV SOLN: 2.0000 g | Freq: Four times a day (QID) | INTRAVENOUS | Status: DC

## 2023-01-28 MED ORDER — PROPOFOL 10 MG/ML IV BOLUS
INTRAVENOUS | Status: DC | PRN
  Administered 2023-01-28: 30 mg via INTRAVENOUS
  Administered 2023-01-28: 100 mg via INTRAVENOUS
  Administered 2023-01-28: 30 mg via INTRAVENOUS

## 2023-01-28 MED ORDER — SODIUM CHLORIDE 0.9 % IV SOLN: INTRAVENOUS | Status: DC

## 2023-01-28 MED ORDER — DEXAMETHASONE SODIUM PHOSPHATE 10 MG/ML IJ SOLN
INTRAMUSCULAR | Status: AC
  Filled 2023-01-28: qty 1

## 2023-01-28 MED ORDER — OXYCODONE HCL 5 MG PO TABS: 5.0000 mg | ORAL_TABLET | ORAL | Status: DC | PRN

## 2023-01-28 MED ORDER — KETOROLAC TROMETHAMINE 15 MG/ML IJ SOLN: 15.0000 mg | Freq: Four times a day (QID) | INTRAMUSCULAR | Status: DC

## 2023-01-28 MED ORDER — BUPIVACAINE IN DEXTROSE 0.75-8.25 % IT SOLN
INTRATHECAL | Status: DC | PRN
  Administered 2023-01-28: 1.8 mL via INTRATHECAL

## 2023-01-28 MED ORDER — ACETAMINOPHEN 10 MG/ML IV SOLN: 1000.0000 mg | Freq: Once | INTRAVENOUS | Status: DC | PRN

## 2023-01-28 MED ORDER — MIDAZOLAM HCL 5 MG/5ML IJ SOLN
INTRAMUSCULAR | Status: DC | PRN
  Administered 2023-01-28: 2 mg via INTRAVENOUS

## 2023-01-28 MED ORDER — TRANEXAMIC ACID-NACL 1000-0.7 MG/100ML-% IV SOLN
1000.0000 mg | INTRAVENOUS | Status: AC
  Administered 2023-01-28: 1000 mg via INTRAVENOUS
  Filled 2023-01-28: qty 100

## 2023-01-28 MED ORDER — SODIUM CHLORIDE (PF) 0.9 % IJ SOLN
INTRAMUSCULAR | Status: AC
  Filled 2023-01-28: qty 30

## 2023-01-28 MED ORDER — ORAL CARE MOUTH RINSE: 15.0000 mL | Freq: Once | OROMUCOSAL | Status: AC

## 2023-01-28 MED ORDER — MIDAZOLAM HCL 2 MG/2ML IJ SOLN
INTRAMUSCULAR | Status: AC
Start: 2023-01-28 — End: ?
  Filled 2023-01-28: qty 2

## 2023-01-28 MED ORDER — BUPIVACAINE-EPINEPHRINE 0.25% -1:200000 IJ SOLN
INTRAMUSCULAR | Status: AC
  Filled 2023-01-28: qty 1

## 2023-01-28 MED ORDER — OXYCODONE HCL 5 MG PO TABS
ORAL_TABLET | ORAL | Status: AC
  Administered 2023-01-28: 5 mg via ORAL
  Filled 2023-01-28: qty 1

## 2023-01-28 MED ORDER — HYDROMORPHONE HCL 1 MG/ML IJ SOLN: 0.2500 mg | INTRAMUSCULAR | Status: DC | PRN

## 2023-01-28 MED ORDER — SODIUM CHLORIDE 0.9 % IR SOLN
Status: DC | PRN
  Administered 2023-01-28: 1000 mL

## 2023-01-28 MED ORDER — OXYCODONE HCL 5 MG PO TABS: 5.0000 mg | ORAL_TABLET | Freq: Once | ORAL | Status: AC | PRN

## 2023-01-28 MED ORDER — LACTATED RINGERS IV SOLN
INTRAVENOUS | Status: DC
Start: 2023-01-28 — End: 2023-01-28

## 2023-01-28 MED ORDER — CELECOXIB 100 MG PO CAPS: 100.0000 mg | ORAL_CAPSULE | Freq: Two times a day (BID) | ORAL | 0 refills | Status: AC

## 2023-01-28 MED ORDER — CHLORHEXIDINE GLUCONATE 0.12 % MT SOLN
15.0000 mL | Freq: Once | OROMUCOSAL | Status: AC
  Administered 2023-01-28: 15 mL via OROMUCOSAL

## 2023-01-28 MED ORDER — CEFAZOLIN SODIUM-DEXTROSE 2-4 GM/100ML-% IV SOLN
2.0000 g | INTRAVENOUS | Status: AC
  Administered 2023-01-28: 2 g via INTRAVENOUS
  Filled 2023-01-28: qty 100

## 2023-01-28 MED ORDER — ACETAMINOPHEN 325 MG PO TABS: 325.0000 mg | ORAL_TABLET | Freq: Four times a day (QID) | ORAL | Status: DC | PRN

## 2023-01-28 MED ORDER — INSULIN ASPART 100 UNIT/ML IJ SOLN: 0.0000 [IU] | INTRAMUSCULAR | Status: DC | PRN

## 2023-01-28 MED ORDER — SODIUM CHLORIDE (PF) 0.9 % IJ SOLN
INTRAMUSCULAR | Status: DC | PRN
  Administered 2023-01-28: 80 mL

## 2023-01-28 MED ORDER — DEXAMETHASONE SODIUM PHOSPHATE 4 MG/ML IJ SOLN: 4.0000 mg | Freq: Once | INTRAMUSCULAR | Status: DC

## 2023-01-28 MED ORDER — METHOCARBAMOL 500 MG PO TABS: 500.0000 mg | ORAL_TABLET | Freq: Four times a day (QID) | ORAL | Status: DC | PRN

## 2023-01-28 MED ORDER — ACETAMINOPHEN 500 MG PO TABS
ORAL_TABLET | ORAL | Status: AC
  Administered 2023-01-28: 1000 mg via ORAL
  Filled 2023-01-28: qty 2

## 2023-01-28 MED ORDER — ISOPROPYL ALCOHOL 70 % SOLN
Status: DC | PRN
  Administered 2023-01-28: 1 via TOPICAL

## 2023-01-28 MED ORDER — ONDANSETRON HCL 4 MG/2ML IJ SOLN
INTRAMUSCULAR | Status: AC
  Filled 2023-01-28: qty 2

## 2023-01-28 MED ORDER — EPHEDRINE SULFATE-NACL 50-0.9 MG/10ML-% IV SOSY
PREFILLED_SYRINGE | INTRAVENOUS | Status: DC | PRN
  Administered 2023-01-28 (×3): 5 mg via INTRAVENOUS
  Administered 2023-01-28: 10 mg via INTRAVENOUS
  Administered 2023-01-28: 15 mg via INTRAVENOUS
  Administered 2023-01-28: 5 mg via INTRAVENOUS

## 2023-01-28 MED ORDER — ACETAMINOPHEN 500 MG PO TABS
1000.0000 mg | ORAL_TABLET | Freq: Once | ORAL | Status: AC
  Administered 2023-01-28: 1000 mg via ORAL

## 2023-01-28 MED ORDER — ACETAMINOPHEN 500 MG PO TABS: 1000.0000 mg | ORAL_TABLET | Freq: Three times a day (TID) | ORAL | Status: AC | PRN

## 2023-01-28 MED ORDER — BUPIVACAINE LIPOSOME 1.3 % IJ SUSP
INTRAMUSCULAR | Status: AC
  Filled 2023-01-28: qty 20

## 2023-01-28 MED ORDER — METHOCARBAMOL 500 MG PO TABS: 500.0000 mg | ORAL_TABLET | Freq: Three times a day (TID) | ORAL | 0 refills | Status: AC | PRN

## 2023-01-28 MED ORDER — POVIDONE-IODINE 10 % EX SWAB: 2.0000 | Freq: Once | CUTANEOUS | Status: DC

## 2023-01-28 MED ORDER — ISOPROPYL ALCOHOL 70 % SOLN
Status: AC
  Filled 2023-01-28: qty 480

## 2023-01-28 MED ORDER — FENTANYL CITRATE (PF) 250 MCG/5ML IJ SOLN
INTRAMUSCULAR | Status: AC
  Filled 2023-01-28: qty 5

## 2023-01-28 MED ORDER — ONDANSETRON HCL 4 MG PO TABS: 4.0000 mg | ORAL_TABLET | Freq: Four times a day (QID) | ORAL | Status: DC | PRN

## 2023-01-28 MED ORDER — PROPOFOL 500 MG/50ML IV EMUL
INTRAVENOUS | Status: DC | PRN
  Administered 2023-01-28: 100 ug/kg/min via INTRAVENOUS

## 2023-01-28 MED ORDER — PHENYLEPHRINE HCL-NACL 20-0.9 MG/250ML-% IV SOLN
INTRAVENOUS | Status: DC | PRN
  Administered 2023-01-28: 30 ug/min via INTRAVENOUS

## 2023-01-28 MED ORDER — DEXAMETHASONE SODIUM PHOSPHATE 10 MG/ML IJ SOLN
INTRAMUSCULAR | Status: DC | PRN
  Administered 2023-01-28: 8 mg via INTRAVENOUS

## 2023-01-28 MED ORDER — PHENYLEPHRINE 80 MCG/ML (10ML) SYRINGE FOR IV PUSH (FOR BLOOD PRESSURE SUPPORT)
PREFILLED_SYRINGE | INTRAVENOUS | Status: DC | PRN
  Administered 2023-01-28 (×4): 160 ug via INTRAVENOUS

## 2023-01-28 MED ORDER — METHOCARBAMOL 500 MG PO TABS
ORAL_TABLET | ORAL | Status: AC
  Administered 2023-01-28: 500 mg via ORAL
  Filled 2023-01-28: qty 1

## 2023-01-28 MED ORDER — BUPIVACAINE LIPOSOME 1.3 % IJ SUSP: 10.0000 mL | Freq: Once | INTRAMUSCULAR | Status: DC

## 2023-01-28 MED ORDER — 0.9 % SODIUM CHLORIDE (POUR BTL) OPTIME
TOPICAL | Status: DC | PRN
  Administered 2023-01-28: 1000 mL

## 2023-01-28 MED ORDER — LACTATED RINGERS IV BOLUS
500.0000 mL | Freq: Once | INTRAVENOUS | Status: AC
  Administered 2023-01-28: 500 mL via INTRAVENOUS

## 2023-01-28 MED ORDER — ONDANSETRON HCL 4 MG/2ML IJ SOLN
INTRAMUSCULAR | Status: DC | PRN
  Administered 2023-01-28: 4 mg via INTRAVENOUS

## 2023-01-28 MED ORDER — OXYCODONE HCL 5 MG/5ML PO SOLN
5.0000 mg | Freq: Once | ORAL | Status: AC | PRN
Start: 2023-01-28 — End: 2023-01-28

## 2023-01-28 MED ORDER — ONDANSETRON HCL 4 MG/2ML IJ SOLN: 4.0000 mg | Freq: Four times a day (QID) | INTRAMUSCULAR | Status: DC | PRN

## 2023-01-28 MED ORDER — ACETAMINOPHEN 500 MG PO TABS: 1000.0000 mg | ORAL_TABLET | Freq: Four times a day (QID) | ORAL | Status: DC

## 2023-01-28 MED ORDER — AMISULPRIDE (ANTIEMETIC) 5 MG/2ML IV SOLN
10.0000 mg | Freq: Once | INTRAVENOUS | Status: DC | PRN
Start: 2023-01-28 — End: 2023-01-28

## 2023-01-28 MED ORDER — FENTANYL CITRATE (PF) 250 MCG/5ML IJ SOLN
INTRAMUSCULAR | Status: DC | PRN
  Administered 2023-01-28 (×3): 50 ug via INTRAVENOUS
  Administered 2023-01-28: 100 ug via INTRAVENOUS

## 2023-01-28 MED ORDER — ONDANSETRON HCL 4 MG/2ML IJ SOLN: 4.0000 mg | Freq: Once | INTRAMUSCULAR | Status: DC | PRN

## 2023-01-28 MED ORDER — ASPIRIN 81 MG PO TBEC: 81.0000 mg | DELAYED_RELEASE_TABLET | Freq: Two times a day (BID) | ORAL | Status: AC

## 2023-01-28 MED ORDER — METHOCARBAMOL 1000 MG/10ML IJ SOLN: 500.0000 mg | Freq: Four times a day (QID) | INTRAMUSCULAR | Status: DC | PRN

## 2023-01-28 MED ORDER — ROCURONIUM BROMIDE 100 MG/10ML IV SOLN
INTRAVENOUS | Status: DC | PRN
  Administered 2023-01-28: 60 mg via INTRAVENOUS

## 2023-01-28 MED ORDER — WATER FOR IRRIGATION, STERILE IR SOLN
Status: DC | PRN
  Administered 2023-01-28: 1000 mL

## 2023-01-28 MED ORDER — HYDROMORPHONE HCL 1 MG/ML IJ SOLN
0.5000 mg | INTRAMUSCULAR | Status: DC | PRN
Start: 2023-01-28 — End: 2023-01-28

## 2023-01-28 MED ORDER — POLYETHYLENE GLYCOL 3350 17 G PO PACK: 17.0000 g | PACK | Freq: Every day | ORAL | 0 refills | Status: AC

## 2023-01-28 SURGICAL SUPPLY — 68 items
BAG COUNTER SPONGE SURGICOUNT (BAG) IMPLANT
BAG ZIPLOCK 12X15 (MISCELLANEOUS) ×1 IMPLANT
BIT DRILL TRIDENT 4X40 SU (BIT) IMPLANT
BLADE SAW SAG 25X90X1.19 (BLADE) ×1 IMPLANT
CEMENT BONE SIMPLEX SPEEDSET (Cement) IMPLANT
CHLORAPREP W/TINT 26 (MISCELLANEOUS) ×2 IMPLANT
CNTNR URN SCR LID CUP LEK RST (MISCELLANEOUS) ×1 IMPLANT
COVER SURGICAL LIGHT HANDLE (MISCELLANEOUS) ×1 IMPLANT
DERMABOND ADVANCED .7 DNX12 (GAUZE/BANDAGES/DRESSINGS) ×1 IMPLANT
DRAPE HIP W/POCKET STRL (MISCELLANEOUS) ×1 IMPLANT
DRAPE INCISE IOBAN 66X45 STRL (DRAPES) ×1 IMPLANT
DRAPE INCISE IOBAN 85X60 (DRAPES) ×1 IMPLANT
DRAPE POUCH INSTRU U-SHP 10X18 (DRAPES) ×1 IMPLANT
DRAPE SHEET LG 3/4 BI-LAMINATE (DRAPES) ×3 IMPLANT
DRAPE U-SHAPE 47X51 STRL (DRAPES) ×2 IMPLANT
DRSG AQUACEL AG ADV 3.5X10 (GAUZE/BANDAGES/DRESSINGS) ×1 IMPLANT
ELECT BLADE TIP CTD 4 INCH (ELECTRODE) ×1 IMPLANT
ELECT REM PT RETURN 15FT ADLT (MISCELLANEOUS) ×1 IMPLANT
GAUZE SPONGE 4X4 12PLY STRL (GAUZE/BANDAGES/DRESSINGS) ×1 IMPLANT
GLOVE BIO SURGEON STRL SZ 6.5 (GLOVE) ×2 IMPLANT
GLOVE BIOGEL PI IND STRL 6.5 (GLOVE) ×1 IMPLANT
GLOVE BIOGEL PI IND STRL 8 (GLOVE) ×1 IMPLANT
GLOVE SURG ORTHO 8.0 STRL STRW (GLOVE) ×2 IMPLANT
GOWN STRL REUS W/ TWL XL LVL3 (GOWN DISPOSABLE) ×2 IMPLANT
HEAD BIOLOX HIP 36/-2.5 (Joint) IMPLANT
HIP BIOLOX HD 36/-2.5 (Joint) ×1 IMPLANT
HOLDER FOLEY CATH W/STRAP (MISCELLANEOUS) ×1 IMPLANT
HOOD PEEL AWAY T7 (MISCELLANEOUS) ×3 IMPLANT
INSERT TRIDENT POLY 36 0DEG (Insert) IMPLANT
KIT BASIN OR (CUSTOM PROCEDURE TRAY) ×1 IMPLANT
KIT TURNOVER KIT A (KITS) IMPLANT
MANIFOLD NEPTUNE II (INSTRUMENTS) ×1 IMPLANT
MARKER SKIN DUAL TIP RULER LAB (MISCELLANEOUS) ×1 IMPLANT
NDL SAFETY ECLIPSE 18X1.5 (NEEDLE) ×2 IMPLANT
NS IRRIG 1000ML POUR BTL (IV SOLUTION) ×1 IMPLANT
PACK TOTAL JOINT (CUSTOM PROCEDURE TRAY) ×1 IMPLANT
PAD ARMBOARD 7.5X6 YLW CONV (MISCELLANEOUS) ×1 IMPLANT
PRESSURIZER FEMORAL UNIV (MISCELLANEOUS) IMPLANT
PROTECTOR NERVE ULNAR (MISCELLANEOUS) IMPLANT
RETRIEVER SUT HEWSON (MISCELLANEOUS) ×1 IMPLANT
SCREW HEX LP 6.5X20 (Screw) IMPLANT
SCREW HEX LP 6.5X30 (Screw) IMPLANT
SEALER BIPOLAR AQUA 6.0 (INSTRUMENTS) IMPLANT
SET HNDPC FAN SPRY TIP SCT (DISPOSABLE) IMPLANT
SHELL CLUSTERHOLE ACETABULAR 5 (Shell) IMPLANT
SHELL GRIPTION 100 48MM (Shell) IMPLANT
SHIELD FACE FULL FLUID (MISCELLANEOUS) ×1 IMPLANT
SOLUTION IRRIG SURGIPHOR (IV SOLUTION) IMPLANT
SPIKE FLUID TRANSFER (MISCELLANEOUS) ×1 IMPLANT
STEM ACCOLADE II SZ7 (Stem) IMPLANT
SUCTION TUBE FRAZIER 12FR DISP (SUCTIONS) ×1 IMPLANT
SUT BONE WAX W31G (SUTURE) ×1 IMPLANT
SUT ETHIBOND #5 BRAIDED 30INL (SUTURE) ×1 IMPLANT
SUT MNCRL AB 3-0 PS2 18 (SUTURE) ×1 IMPLANT
SUT STRATAFIX 0 PDS 27 VIOLET (SUTURE) ×1
SUT STRATAFIX 14 PDO 48 VLT (SUTURE) ×1 IMPLANT
SUT STRATAFIX PDO 1 14 VIOLET (SUTURE) ×1
SUT VIC AB 2-0 CT2 27 (SUTURE) ×2 IMPLANT
SUTURE STRATFX 0 PDS 27 VIOLET (SUTURE) ×1 IMPLANT
SYR 30ML LL (SYRINGE) ×1 IMPLANT
SYR 50ML LL SCALE MARK (SYRINGE) ×1 IMPLANT
TOWEL OR 17X26 10 PK STRL BLUE (TOWEL DISPOSABLE) ×1 IMPLANT
TOWER CARTRIDGE SMART MIX (DISPOSABLE) IMPLANT
TRAY FOLEY MTR SLVR 16FR STAT (SET/KITS/TRAYS/PACK) IMPLANT
TRAY FOLEY SLVR 14FR TEMP STAT (SET/KITS/TRAYS/PACK) IMPLANT
TUBE SUCTION HIGH CAP CLEAR NV (SUCTIONS) ×1 IMPLANT
UNDERPAD 30X36 HEAVY ABSORB (UNDERPADS AND DIAPERS) ×1 IMPLANT
WATER STERILE IRR 1000ML POUR (IV SOLUTION) ×2 IMPLANT

## 2023-01-28 NOTE — Anesthesia Procedure Notes (Addendum)
 Spinal  Patient location during procedure: OR Start time: 01/28/2023 10:00 AM End time: 01/28/2023 10:15 AM Reason for block: surgical anesthesia Staffing Performed: anesthesiologist  Anesthesiologist: Erma Thom SAUNDERS, MD Performed by: Erma Thom SAUNDERS, MD Authorized by: Erma Thom SAUNDERS, MD   Preanesthetic Checklist Completed: patient identified, IV checked, site marked, risks and benefits discussed, surgical consent, monitors and equipment checked, pre-op evaluation and timeout performed Spinal Block Patient position: sitting Prep: DuraPrep Patient monitoring: heart rate, cardiac monitor, continuous pulse ox and blood pressure Approach: midline Location: L4-5 Needle Needle type: Pencan  Needle gauge: 24 G Additional Notes Functioning IV was confirmed and monitors were applied. Sterile prep and drape, including hand hygiene and sterile gloves were used. The patient was positioned and the spine was prepped. The skin was anesthetized with lidocaine.  Free flow of clear CSF was obtained prior to injecting local anesthetic into the CSF.  The spinal needle aspirated freely following injection.  The needle was carefully withdrawn.  The patient tolerated the procedure well.

## 2023-01-28 NOTE — Discharge Instructions (Signed)
 INSTRUCTIONS AFTER JOINT REPLACEMENT   Remove items at home which could result in a fall. This includes throw rugs or furniture in walking pathways ICE to the affected joint every three hours while awake for 30 minutes at a time, for at least the first 3-5 days, and then as needed for pain and swelling.  Continue to use ice for pain and swelling. You may notice swelling that will progress down to the foot and ankle.  This is normal after surgery.  Elevate your leg when you are not up walking on it.   Continue to use the breathing machine you got in the hospital (incentive spirometer) which will help keep your temperature down.  It is common for your temperature to cycle up and down following surgery, especially at night when you are not up moving around and exerting yourself.  The breathing machine keeps your lungs expanded and your temperature down.  DIET:  As you were doing prior to hospitalization, we recommend a well-balanced diet.  DRESSING / WOUND CARE / SHOWERING:  Keep the surgical dressing until follow up.  The dressing is water proof, so you can shower without any extra covering.  IF THE DRESSING FALLS OFF or the wound gets wet inside, change the dressing with sterile gauze.  Please use good hand washing techniques before changing the dressing.  Do not use any lotions or creams on the incision until instructed by your surgeon.    ACTIVITY  Increase activity slowly as tolerated, but follow the weight bearing instructions below.   No driving for 6 weeks or until further direction given by your physician.  You cannot drive while taking narcotics.  No lifting or carrying greater than 10 lbs. until further directed by your surgeon. Avoid periods of inactivity such as sitting longer than an hour when not asleep. This helps prevent blood clots.  You may return to work once you are authorized by your doctor.   WEIGHT BEARING: Weight bearing as tolerated with assist device (walker, cane, etc) as  directed, use it as long as suggested by your surgeon or therapist, typically at least 4-6 weeks.  EXERCISES  Results after joint replacement surgery are often greatly improved when you follow the exercise, range of motion and muscle strengthening exercises prescribed by your doctor. Safety measures are also important to protect the joint from further injury. Any time any of these exercises cause you to have increased pain or swelling, decrease what you are doing until you are comfortable again and then slowly increase them. If you have problems or questions, call your caregiver or physical therapist for advice.   Rehabilitation is important following a joint replacement. After just a few days of immobilization, the muscles of the leg can become weakened and shrink (atrophy).  These exercises are designed to build up the tone and strength of the thigh and leg muscles and to improve motion. Often times heat used for twenty to thirty minutes before working out will loosen up your tissues and help with improving the range of motion but do not use heat for the first two weeks following surgery (sometimes heat can increase post-operative swelling).   These exercises can be done on a training (exercise) mat, on the floor, on a table or on a bed. Use whatever works the best and is most comfortable for you.    Use music or television while you are exercising so that the exercises are a pleasant break in your day. This will make your life  better with the exercises acting as a break in your routine that you can look forward to.   Perform all exercises about fifteen times, three times per day or as directed.  You should exercise both the operative leg and the other leg as well.  Exercises include:   Quad Sets - Tighten up the muscle on the front of the thigh (Quad) and hold for 5-10 seconds.   Straight Leg Raises - With your knee straight (if you were given a brace, keep it on), lift the leg to 60 degrees, hold  for 3 seconds, and slowly lower the leg.  Perform this exercise against resistance later as your leg gets stronger.  Leg Slides: Lying on your back, slowly slide your foot toward your buttocks, bending your knee up off the floor (only go as far as is comfortable). Then slowly slide your foot back down until your leg is flat on the floor again.  Angel Wings: Lying on your back spread your legs to the side as far apart as you can without causing discomfort.  Hamstring Strength:  Lying on your back, push your heel against the floor with your leg straight by tightening up the muscles of your buttocks.  Repeat, but this time bend your knee to a comfortable angle, and push your heel against the floor.  You may put a pillow under the heel to make it more comfortable if necessary.   A rehabilitation program following joint replacement surgery can speed recovery and prevent re-injury in the future due to weakened muscles. Contact your doctor or a physical therapist for more information on knee rehabilitation.   CONSTIPATION:  Constipation is defined medically as fewer than three stools per week and severe constipation as less than one stool per week.  Even if you have a regular bowel pattern at home, your normal regimen is likely to be disrupted due to multiple reasons following surgery.  Combination of anesthesia, postoperative narcotics, change in appetite and fluid intake all can affect your bowels.   YOU MUST use at least one of the following options; they are listed in order of increasing strength to get the job done.  They are all available over the counter, and you may need to use some, POSSIBLY even all of these options:    Drink plenty of fluids (prune juice may be helpful) and high fiber foods Colace 100 mg by mouth twice a day  Senokot for constipation as directed and as needed Dulcolax (bisacodyl), take with full glass of water  Miralax (polyethylene glycol) once or twice a day as needed.  If you  have tried all these things and are unable to have a bowel movement in the first 3-4 days after surgery call either your surgeon or your primary doctor.    If you experience loose stools or diarrhea, hold the medications until you stool forms back up.  If your symptoms do not get better within 1 week or if they get worse, check with your doctor.  If you experience "the worst abdominal pain ever" or develop nausea or vomiting, please contact the office immediately for further recommendations for treatment.  ITCHING:  If you experience itching with your medications, try taking only a single pain pill, or even half a pain pill at a time.  You can also use Benadryl over the counter for itching or also to help with sleep.   TED HOSE STOCKINGS:  Use stockings on both legs until for at least 2 weeks or  as directed by physician office. They may be removed at night for sleeping.  MEDICATIONS:  See your medication summary on the "After Visit Summary" that nursing will review with you.  You may have some home medications which will be placed on hold until you complete the course of blood thinner medication.  It is important for you to complete the blood thinner medication as prescribed.  Blood clot prevention (DVT Prophylaxis): After surgery you are at an increased risk for a blood clot. you were prescribed a blood thinner, Aspirin 81mg , to be taken twice daily for a total of 4 weeks from surgery to help reduce your risk of getting a blood clot.  Signs of a pulmonary embolus (blood clot in the lungs) include sudden short of breath, feeling lightheaded or dizzy, chest pain with a deep breath, rapid pulse rapid breathing.  Signs of a blood clot in your arms or legs include new unexplained swelling and cramping, warm, red or darkened skin around the painful area.  Please call the office or 911 right away if these signs or symptoms develop.  PRECAUTIONS:   If you experience chest pain or shortness of breath - call 911  immediately for transfer to the hospital emergency department.   If you develop a fever greater that 101 F, purulent drainage from wound, increased redness or drainage from wound, foul odor from the wound/dressing, or calf pain - CONTACT YOUR SURGEON.                                                   FOLLOW-UP APPOINTMENTS:  If you do not already have a post-op appointment, please call the office for an appointment to be seen by your surgeon.  Guidelines for how soon to be seen are listed in your "After Visit Summary", but are typically between 2-3 weeks after surgery.  If you have a specialized bandage, you may be told to follow up 1 week after surgery.  POST-OPERATIVE OPIOID TAPER INSTRUCTIONS: It is important to wean off of your opioid medication as soon as possible. If you do not need pain medication after your surgery it is ok to stop day one. Opioids include: Codeine, Hydrocodone(Norco, Vicodin), Oxycodone(Percocet, oxycontin) and hydromorphone amongst others.  Long term and even short term use of opiods can cause: Increased pain response Dependence Constipation Depression Respiratory depression And more.  Withdrawal symptoms can include Flu like symptoms Nausea, vomiting And more Techniques to manage these symptoms Hydrate well Eat regular healthy meals Stay active Use relaxation techniques(deep breathing, meditating, yoga) Do Not substitute Alcohol to help with tapering If you have been on opioids for less than two weeks and do not have pain than it is ok to stop all together.  Plan to wean off of opioids This plan should start within one week post op of your joint replacement. Maintain the same interval or time between taking each dose and first decrease the dose.  Cut the total daily intake of opioids by one tablet each day Next start to increase the time between doses. The last dose that should be eliminated is the evening dose.   MAKE SURE YOU:  Understand these  instructions.  Get help right away if you are not doing well or get worse.    Thank you for letting us be a part of your medical care team.  It is a privilege we respect greatly.  We hope these instructions will help you stay on track for a fast and full recovery!

## 2023-01-28 NOTE — Evaluation (Signed)
 Physical Therapy Evaluation Patient Details Name: Ruth Johnston MRN: [id number] DOB: [birth year] Today's Date: 01/28/2023  History of Present Illness  63 yo female presents to therapy s/p R THA posterior lateral approach on 01/28/2023 due to failure of conservative measures. Pt is currently R LE WBAT and no formal hip precautions. 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  Clinical Impression   Ruth Johnston is a 63 y.o. female POD 0 s/p R THA. Patient reports mod I with occasional use of SPC with mobility at baseline. Patient is now limited by functional impairments (see PT problem list below) and requires CGA and cues for transfers and gait with RW. Patient was able to ambulate 55 feet with RW and CGA and progressing to close S and cues for safe walker management. Patient educated on safe sequencing for stair mobility, fall risk prevention, slowly increasing activity levels, pain management and goal, use of CP/ice and car transfers pt and son verbalized understanding of safe guarding position for people assisting with mobility. Patient instructed in exercises to facilitate ROM and circulation. Patient will benefit from continued skilled PT interventions to address impairments and progress towards PLOF. Patient has met mobility goals at adequate level for discharge home with family support and OPPT services scheduled for 1/16; will continue to follow if pt continues acute stay to progress towards Mod I goals.       If plan is discharge home, recommend the following: A little help with walking and/or transfers;A little help with bathing/dressing/bathroom;Assistance with cooking/housework;Assist for transportation;Help with stairs or ramp for entrance   Can travel by private vehicle        Equipment Recommendations None recommended by PT  Recommendations for Other Services       Functional Status Assessment Patient has  had a recent decline in their functional status and demonstrates the ability to make significant improvements in function in a reasonable and predictable amount of time.     Precautions / Restrictions Precautions Precautions: Fall Restrictions Weight Bearing Restrictions Per Provider Order: No      Mobility  Bed Mobility Overal bed mobility: Needs Assistance Bed Mobility: Supine to Sit     Supine to sit: Supervision     General bed mobility comments: min cues    Transfers Overall transfer level: Needs assistance Equipment used: Rolling walker (2 wheels) Transfers: Sit to/from Stand Sit to Stand: Contact guard assist           General transfer comment: min cues for proper UE and AD placement    Ambulation/Gait Ambulation/Gait assistance: Contact guard assist Gait Distance (Feet): 55 Feet Assistive device: Rolling walker (2 wheels) Gait Pattern/deviations: Step-to pattern, Antalgic, Trunk flexed Gait velocity: decreased     General Gait Details: slight trunk flexion and use of B UE support at RW to offload R LE in stance phase, min cues for posture  Stairs Stairs: Yes Stairs assistance: Contact guard assist Stair Management: Two rails Number of Stairs: 5 General stair comments: min cues for step to pattern, sequencing and technique with pt attempting reciprocal pattern  Wheelchair Mobility     Tilt Bed    Modified Rankin (Stroke Patients Only)       Balance Overall balance assessment: Needs assistance Sitting-balance support: Feet supported Sitting balance-Leahy Scale: Good     Standing balance support: Bilateral upper extremity supported, During functional activity, Reliant on assistive device for balance Standing balance-Leahy Scale: Poor  Pertinent Vitals/Pain Pain Assessment Pain Assessment: 0-10 Pain Score: 1  Pain Location: R hip and LE Pain Descriptors / Indicators: Aching, Discomfort,  Grimacing, Operative site guarding Pain Intervention(s): Limited activity within patient's tolerance, Monitored during session, Premedicated before session, Repositioned, Ice applied    Home Living Family/patient expects to be discharged to:: Private residence Living Arrangements: Alone Available Help at Discharge: Family Type of Home: Apartment Home Access: Stairs to enter Entrance Stairs-Rails: Right;Can reach Technical Sales Engineer of Steps: 15   Home Layout: One level Home Equipment: Agricultural Consultant (2 wheels);Cane - single point Warden/ranger)      Prior Function Prior Level of Function : Independent/Modified Independent;Driving             Mobility Comments: Intermittent use of SPC for mobility pending pain, mod I with all ADLs, self care tasks, IADLs ADLs Comments: active and going to the gym     Extremity/Trunk Assessment        Lower Extremity Assessment Lower Extremity Assessment: RLE deficits/detail RLE Deficits / Details: ankle DF/PF 5/5 RLE Sensation: decreased light touch    Cervical / Trunk Assessment Cervical / Trunk Assessment: Normal  Communication   Communication Communication: No apparent difficulties  Cognition Arousal: Alert Behavior During Therapy: WFL for tasks assessed/performed Overall Cognitive Status: Within Functional Limits for tasks assessed                                          General Comments      Exercises Total Joint Exercises Ankle Circles/Pumps: AROM, Both, 10 reps Quad Sets: AROM, Right, 5 reps Heel Slides: AROM, Right, 5 reps Hip ABduction/ADduction: AROM, Right, 5 reps, Standing Long Arc Quad: AROM, Right, 5 reps Knee Flexion: AROM, Right, 5 reps, Standing Standing Hip Extension: AROM, Right, 5 reps   Assessment/Plan    PT Assessment Patient needs continued PT services  PT Problem List Decreased strength;Decreased range of motion;Decreased balance;Decreased activity  tolerance;Decreased mobility;Decreased coordination;Pain       PT Treatment Interventions DME instruction;Gait training;Stair training;Functional mobility training;Therapeutic exercise;Therapeutic activities;Balance training;Neuromuscular re-education;Patient/family education;Modalities    PT Goals (Current goals can be found in the Care Plan section)  Acute Rehab PT Goals Patient Stated Goal: get on the treadmill, bowl, line dance, roller skate, visit with grandchild due in July, exercise PT Goal Formulation: With patient Time For Goal Achievement: 02/11/23 Potential to Achieve Goals: Good    Frequency 7X/week     Co-evaluation               AM-PAC PT 6 Clicks Mobility  Outcome Measure Help needed turning from your back to your side while in a flat bed without using bedrails?: None Help needed moving from lying on your back to sitting on the side of a flat bed without using bedrails?: None Help needed moving to and from a bed to a chair (including a wheelchair)?: A Little Help needed standing up from a chair using your arms (e.g., wheelchair or bedside chair)?: A Little Help needed to walk in hospital room?: A Little Help needed climbing 3-5 steps with a railing? : A Little 6 Click Score: 20    End of Session Equipment Utilized During Treatment: Gait belt       PT Visit Diagnosis: Unsteadiness on feet (R26.81);Other abnormalities of gait and mobility (R26.89);Muscle weakness (generalized) (M62.81);Difficulty in walking, not elsewhere classified (R26.2);Pain Pain - Right/Left: Right  Pain - part of body: Leg;Hip    Time: 8465-8387 PT Time Calculation (min) (ACUTE ONLY): 38 min   Charges:   PT Evaluation $PT Eval Low Complexity: 1 Low PT Treatments $Gait Training: 8-22 mins $Therapeutic Exercise: 8-22 mins PT General Charges $$ ACUTE PT VISIT: 1 Visit         [place], PT Acute Rehab   [place] VEAR Drone 01/28/2023, 4:18 PM

## 2023-01-28 NOTE — Anesthesia Procedure Notes (Signed)
 Procedure Name: MAC Date/Time: 01/28/2023 10:12 AM  Performed by: Judythe Tanda Aran, CRNAPre-anesthesia Checklist: Patient identified, Emergency Drugs available, Suction available and Patient being monitored Patient Re-evaluated:Patient Re-evaluated prior to induction Oxygen Delivery Method: Simple face mask

## 2023-01-28 NOTE — Interval H&P Note (Signed)
The patient has been re-examined, and the chart reviewed, and there have been no interval changes to the documented history and physical.    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   The risks, benefits, and alternatives have been discussed at length with patient, and the patient is willing to proceed.  Right hip marked. Consent has been signed.

## 2023-01-28 NOTE — Anesthesia Procedure Notes (Signed)
 Procedure Name: Intubation Date/Time: 01/28/2023 10:25 AM  Performed by: Judythe Tanda Aran, CRNAPre-anesthesia Checklist: Patient identified, Emergency Drugs available, Suction available and Patient being monitored Patient Re-evaluated:Patient Re-evaluated prior to induction Oxygen Delivery Method: Circle system utilized Preoxygenation: Pre-oxygenation with 100% oxygen Induction Type: IV induction Ventilation: Mask ventilation without difficulty Laryngoscope Size: 2 and Miller Grade View: Grade I Tube type: Oral Tube size: 7.0 mm Number of attempts: 1 Airway Equipment and Method: Stylet Placement Confirmation: ETT inserted through vocal cords under direct vision, positive ETCO2 and breath sounds checked- equal and bilateral Secured at: 22 cm Tube secured with: Tape Dental Injury: Teeth and Oropharynx as per pre-operative assessment

## 2023-01-28 NOTE — Transfer of Care (Signed)
 Immediate Anesthesia Transfer of Care Note  Patient: Ruth Johnston  Procedure(s) Performed: TOTAL HIP ARTHROPLASTY (Right: Hip)  Patient Location: PACU  Anesthesia Type:General  Level of Consciousness: awake and patient cooperative  Airway & Oxygen Therapy: Patient Spontanous Breathing and Patient connected to face mask  Post-op Assessment: Report given to RN and Post -op Vital signs reviewed and stable  Post vital signs: Reviewed and stable  Last Vitals:  Vitals Value Taken Time  BP 120/76 01/28/23 1223  Temp    Pulse 77 01/28/23 1226  Resp 14 01/28/23 1226  SpO2 100 % 01/28/23 1226  Vitals shown include unfiled device data.  Last Pain:  Vitals:   01/28/23 0746  TempSrc:   PainSc: 0-No pain         Complications: No notable events documented.

## 2023-01-28 NOTE — Anesthesia Postprocedure Evaluation (Signed)
 Anesthesia Post Note  Patient: Ruth Johnston  Procedure(s) Performed: TOTAL HIP ARTHROPLASTY (Right: Hip)     Patient location during evaluation: Nursing Unit Anesthesia Type: Spinal Level of consciousness: oriented and awake and alert Pain management: pain level controlled Vital Signs Assessment: post-procedure vital signs reviewed and stable Respiratory status: spontaneous breathing and respiratory function stable Cardiovascular status: blood pressure returned to baseline and stable Postop Assessment: no headache, no backache, no apparent nausea or vomiting and patient able to bend at knees Anesthetic complications: no   No notable events documented.  Last Vitals:  Vitals:   01/28/23 1345 01/28/23 1400  BP: 118/76 110/73  Pulse: 91 87  Resp: 16 12  Temp: (!) 36.1 C (!) 36.4 C  SpO2: 100% 95%    Last Pain:  Vitals:   01/28/23 1400  TempSrc:   PainSc: 0-No pain                 [name]

## 2023-01-28 NOTE — Op Note (Signed)
 01/28/2023  11:59 AM  PATIENT:  Ruth Johnston   MRN: [id number]  PRE-OPERATIVE DIAGNOSIS: End-stage right hip osteoarthritis  POST-OPERATIVE DIAGNOSIS:  same  PROCEDURE:  RIGHT TOTAL HIP ARTHROPLASTY  PREOPERATIVE INDICATIONS:    Ruth Johnston is an 63 y.o. female who has a diagnosis of End-stage right hip osteoarthritis and elected for surgical management after failing conservative treatment.  The risks benefits and alternatives were discussed with the patient including but not limited to the risks of nonoperative treatment, versus surgical intervention including infection, bleeding, nerve injury, periprosthetic fracture, the need for revision surgery, dislocation, leg length discrepancy, blood clots, cardiopulmonary complications, morbidity, mortality, among others, and they were willing to proceed.     OPERATIVE REPORT     SURGEON:  Toribio Higashi, MD    ASSISTANT: Bernarda Mclean, PA-C, (Present throughout the entire procedure,  necessary for completion of procedure in a timely manner, assisting with retraction, instrumentation, and closure)     ANESTHESIA: Spinal  ESTIMATED BLOOD LOSS: 250cc    COMPLICATIONS:  None.   COMPONENTS:   [employer] Trident 2 52 mm acetabular shell, 6.5 hex screws x 2, Accolade 2 size #7 with 132 degree neck angle, 36-2.5 mm ceramic head Implant Name Type Inv. Item Serial No. Manufacturer Lot No. LRB No. Used Action  SHELL CLUSTERHOLE ACETABULAR 5 - [id number] Shell SHELL CLUSTERHOLE ACETABULAR 5  [employer] [id number] Right 1 Implanted  SCREW HEX LP 6.5X30 - [id number] Screw SCREW HEX LP 6.5X30  [employer] [id number] Right 1 Implanted  INSERT TRIDENT POLY 36 0DEG - [id number] Insert INSERT TRIDENT POLY 36 0DEG  [employer] [id number] Right 1 Implanted  SCREW HEX LP 6.5X20 - [id number] Screw SCREW HEX LP 6.5X20  [employer] [id number] Right 1 Implanted  STEM ACCOLADE II SZ7 - [id number] Stem STEM ACCOLADE II SZ7  [employer] [id number] Right 1 Implanted  HIP BIOLOX HD 36/-2.5 - [id number] Joint HIP BIOLOX HD 36/-2.5  [employer] [id number] Right 1 Implanted      PROCEDURE IN DETAIL:   The patient was met in the holding area and  identified.  The appropriate hip was identified and marked at the operative site.  The patient was then transported to the OR  and  placed under anesthesia.  At that point, the patient was  placed in the lateral decubitus position with the operative side up and  secured to the operating room table  and all bony prominences padded. A subaxillary role was also placed.    The operative lower extremity was prepped from the iliac crest to the distal leg.  Sterile draping was performed.  Preoperative antibiotics, 2 gm of ancef ,1 gm of Tranexamic Acid , and 8 mg of Decadron  administered. Time out was performed prior to incision.      A routine posterolateral approach was utilized via sharp dissection  carried down to the subcutaneous tissue.  Gross bleeders were Bovie coagulated.  The iliotibial band was identified and incised along the length of the skin incision through the glute max fascia.  Charnley retractor was placed with care to protect the sciatic nerve posteriorly.  With the hip internally rotated, the piriformis tendon was identified and released from the femoral insertion and tagged with a #5 Ethibond.  A capsulotomy was then performed off the femoral insertion and also tagged with a #5 Ethibond.    The femoral neck was exposed, and I resected the femoral neck based on preoperative templating relative to the lesser trochanter.    I  then exposed the deep acetabulum, cleared out any tissue including the ligamentum teres.  After adequate visualization, I excised the labrum.  I then started reaming with a 48 mm reamer, first medializing to the floor of the cotyloid fossa, and then in the position of the cup aiming towards the greater sciatic notch, matching the version of the  transverse acetabular ligament and tucked under the anterior wall. I reamed up to 52 mm reamer with good bony bed preparation and a 52 mm cup was chosen.  The real cup was then impacted into place.  Appropriate version and inclination was confirmed clinically matching their bony anatomy, and also with the use of the jig.  I placed 2 screws in the posterior superior quadrant to augment fixation.  A neutral liner was placed and impacted. It was confirmed to be appropriately seated and the acetabular retractors were removed.    I then prepared the proximal femur using the box cutter, Charnley awl, and then sequentially broached starting with 0 up to a size 7.  A trial broach, neck, and head was utilized, and I reduced the hip and it was found to have excellent stability.  There was no impingement with full extension and 90 degrees external rotation.  The hip was stable at the position of sleep and with 90 degrees flexion and 70 degrees of internal rotation.  Leg lengths were also clinically assessed in the lateral position and felt to be equal. Intra-Op flatplate was obtained and confirmed appropriate component positions.  Good fill of the femur with the size 7 broach.  And restoration of leg length and offset. No evidence or concern for fracture.  A final femoral prosthesis size 7 was selected. I then impacted the real femoral prosthesis into place.I again trialed and selected a 36 -2.73mm ball. The hip was then reduced and taken through a range of motion. There was no impingement with full extension and 90 degrees external rotation.  The hip was stable at the position of sleep and with 90 degrees flexion and 70 degrees of internal rotation. Leg lengths were  again assessed and felt to be restored.  We then opened, and I impacted the real head ball into place.  The posterior capsule was then closed with #5 Ethibond.  The piriformis was repaired through the base of the abductor tendon using a [place] suture  passer.  I then irrigated the hip copiously with dilute Betadine  and with normal saline pulse lavage. Periarticular injection was then performed with Exparel .   We repaired the fascia #1 barbed suture, followed by 0 barbed suture for the subcutaneous fat.  Skin was closed with 2-0 Vicryl and 3-0 Monocryl.  Dermabond and Aquacel dressing were applied. The patient was then awakened and returned to PACU in stable and satisfactory condition.  Leg lengths in the supine position were assessed and felt to be clinically equal. There were no complications.  Post op recs: WB: WBAT RLE, No formal hip precautions Abx: ancef  Imaging: PACU pelvis Xray Dressing: Aquacell, keep intact until follow up DVT prophylaxis: Aspirin  81BID starting POD1 Follow up: 2 weeks after surgery for a wound check with Dr. Edna at [facility].  Address: [address], [place], [place] [address]  Office Phone: [phone]   Toribio Edna, MD Orthopedic Surgeon

## 2023-01-29 ENCOUNTER — Encounter (HOSPITAL_COMMUNITY): Payer: Self-pay | Admitting: Orthopedic Surgery

## 2023-01-31 DIAGNOSIS — M25551 Pain in right hip: Secondary | ICD-10-CM | POA: Diagnosis not present

## 2023-02-12 DIAGNOSIS — M1611 Unilateral primary osteoarthritis, right hip: Secondary | ICD-10-CM | POA: Diagnosis not present

## 2023-02-15 DIAGNOSIS — M25551 Pain in right hip: Secondary | ICD-10-CM | POA: Diagnosis not present

## 2023-02-21 DIAGNOSIS — M25551 Pain in right hip: Secondary | ICD-10-CM | POA: Diagnosis not present

## 2023-02-28 DIAGNOSIS — M25551 Pain in right hip: Secondary | ICD-10-CM | POA: Diagnosis not present

## 2023-03-12 DIAGNOSIS — M1611 Unilateral primary osteoarthritis, right hip: Secondary | ICD-10-CM | POA: Diagnosis not present

## 2023-03-14 DIAGNOSIS — M25551 Pain in right hip: Secondary | ICD-10-CM | POA: Diagnosis not present

## 2023-03-22 DIAGNOSIS — M25551 Pain in right hip: Secondary | ICD-10-CM | POA: Diagnosis not present

## 2023-04-23 DIAGNOSIS — M1611 Unilateral primary osteoarthritis, right hip: Secondary | ICD-10-CM | POA: Diagnosis not present

## 2023-06-18 DIAGNOSIS — N1831 Chronic kidney disease, stage 3a: Secondary | ICD-10-CM | POA: Diagnosis not present

## 2023-06-18 DIAGNOSIS — E669 Obesity, unspecified: Secondary | ICD-10-CM | POA: Diagnosis not present

## 2023-06-18 DIAGNOSIS — I10 Essential (primary) hypertension: Secondary | ICD-10-CM | POA: Diagnosis not present

## 2023-06-18 DIAGNOSIS — E1122 Type 2 diabetes mellitus with diabetic chronic kidney disease: Secondary | ICD-10-CM | POA: Diagnosis not present

## 2023-06-18 DIAGNOSIS — E785 Hyperlipidemia, unspecified: Secondary | ICD-10-CM | POA: Diagnosis not present

## 2023-08-29 DIAGNOSIS — N1831 Chronic kidney disease, stage 3a: Secondary | ICD-10-CM | POA: Diagnosis not present

## 2023-08-29 DIAGNOSIS — I10 Essential (primary) hypertension: Secondary | ICD-10-CM | POA: Diagnosis not present

## 2023-08-29 DIAGNOSIS — E669 Obesity, unspecified: Secondary | ICD-10-CM | POA: Diagnosis not present

## 2023-08-29 DIAGNOSIS — Z0000 Encounter for general adult medical examination without abnormal findings: Secondary | ICD-10-CM | POA: Diagnosis not present

## 2023-08-29 DIAGNOSIS — E1122 Type 2 diabetes mellitus with diabetic chronic kidney disease: Secondary | ICD-10-CM | POA: Diagnosis not present

## 2023-08-29 DIAGNOSIS — E785 Hyperlipidemia, unspecified: Secondary | ICD-10-CM | POA: Diagnosis not present

## 2023-10-07 DIAGNOSIS — Z124 Encounter for screening for malignant neoplasm of cervix: Secondary | ICD-10-CM | POA: Diagnosis not present

## 2023-10-07 DIAGNOSIS — Z01419 Encounter for gynecological examination (general) (routine) without abnormal findings: Secondary | ICD-10-CM | POA: Diagnosis not present

## 2023-10-07 DIAGNOSIS — R351 Nocturia: Secondary | ICD-10-CM | POA: Diagnosis not present

## 2023-10-07 DIAGNOSIS — Z780 Asymptomatic menopausal state: Secondary | ICD-10-CM | POA: Diagnosis not present

## 2023-10-07 DIAGNOSIS — N951 Menopausal and female climacteric states: Secondary | ICD-10-CM | POA: Diagnosis not present

## 2023-11-26 DIAGNOSIS — Z13820 Encounter for screening for osteoporosis: Secondary | ICD-10-CM | POA: Diagnosis not present

## 2023-11-26 DIAGNOSIS — Z780 Asymptomatic menopausal state: Secondary | ICD-10-CM | POA: Diagnosis not present
